# Patient Record
Sex: Female | Born: 1980 | Race: Black or African American | Hispanic: No | Marital: Single | Smoking: Former smoker
Health system: Southern US, Community
[De-identification: ages and names within clinical notes are randomized; demographics above are authoritative.]

## PROBLEM LIST (undated history)

## (undated) DIAGNOSIS — F419 Anxiety disorder, unspecified: Secondary | ICD-10-CM

## (undated) DIAGNOSIS — I10 Essential (primary) hypertension: Secondary | ICD-10-CM

## (undated) DIAGNOSIS — K219 Gastro-esophageal reflux disease without esophagitis: Secondary | ICD-10-CM

## (undated) HISTORY — PX: NO PAST SURGERIES: SHX2092

---
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
## 2009-08-08 ENCOUNTER — Emergency Department (HOSPITAL_COMMUNITY): Admission: EM | Admit: 2009-08-08 | Discharge: 2009-08-08 | Payer: Self-pay | Admitting: Emergency Medicine

## 2009-08-19 ENCOUNTER — Emergency Department (HOSPITAL_COMMUNITY): Admission: EM | Admit: 2009-08-19 | Discharge: 2009-08-19 | Payer: Self-pay | Admitting: Emergency Medicine

## 2011-06-02 ENCOUNTER — Emergency Department (INDEPENDENT_AMBULATORY_CARE_PROVIDER_SITE_OTHER)
Admission: EM | Admit: 2011-06-02 | Discharge: 2011-06-02 | Disposition: A | Discharge status: Home / Self Care | Payer: Self-pay | Source: Home / Self Care | Attending: Family Medicine | Admitting: Family Medicine

## 2011-06-02 ENCOUNTER — Encounter: Payer: Self-pay | Admitting: *Deleted

## 2011-06-02 DIAGNOSIS — IMO0002 Reserved for concepts with insufficient information to code with codable children: Secondary | ICD-10-CM

## 2011-06-02 MED ORDER — CEPHALEXIN 500 MG PO CAPS: 500.0000 mg | ORAL_CAPSULE | Freq: Four times a day (QID) | ORAL | Status: AC

## 2011-06-02 NOTE — ED Notes (Signed)
Pt c/o pain and swelling to end of right index finger onset Saturday.  No known injury.

## 2011-06-02 NOTE — ED Provider Notes (Signed)
History     CSN: [id number] Arrival date & time: 06/02/2011  8:15 AM   First MD Initiated Contact with Patient 06/02/11 [phone]      Chief Complaint  Patient presents with  . Hand Pain    (Consider location/radiation/quality/duration/timing/severity/associated sxs/prior treatment) HPI Comments: Theresa Burke presents for evaluation of pain and swelling in the distal tip of her RIGHT index finger. She denies any injury but she is unsure. There is a central puncture wound.  Patient is a 30 y.o. female presenting with hand pain. The history is provided by the patient.  Hand Pain This is a new problem. The current episode started more than 2 days ago. The problem occurs constantly. The problem has been gradually worsening. The symptoms are aggravated by nothing. The symptoms are relieved by nothing.    No past medical history on file.  No past surgical history on file.  No family history on file.  History  Substance Use Topics  . Smoking status: Not on file  . Smokeless tobacco: Not on file  . Alcohol Use: Not on file    OB History    No data available      Review of Systems  Constitutional: Negative.   HENT: Negative.   Eyes: Negative.   Respiratory: Negative.   Cardiovascular: Negative.   Gastrointestinal: Negative.   Genitourinary: Negative.   Musculoskeletal: Negative.   Skin: Positive for wound.       Pain and swelling in tip of RIGHT index finger  Neurological: Negative.     Allergies  Review of patient's allergies indicates no known allergies.  Home Medications  No current outpatient prescriptions on file.  BP 132/77  Pulse 99  Temp(Src) 98.7 F (37.1 C) (Oral)  Resp 18  SpO2 99%  Physical Exam  Constitutional: She is oriented to person, place, and time. She appears well-developed and well-nourished.  HENT:  Head: Normocephalic and atraumatic.  Eyes: EOM are normal.  Neck: Normal range of motion.  Pulmonary/Chest: Effort normal and breath sounds  normal.  Neurological: She is alert and oriented to person, place, and time.  Skin: Skin is warm and dry.       ED Course  INCISION AND DRAINAGE Date/Time: 06/02/2011 8:56 AM Performed by: Richardo Priest Authorized by: Richardo Priest Consent: Verbal consent obtained. Risks and benefits: risks, benefits and alternatives were discussed Type: abscess Body area: upper extremity Location details: right index finger Local anesthetic: topical anesthetic and lidocaine spray Scalpel size: 11 Incision type: single straight Complexity: simple Drainage: purulent Drainage amount: copious Patient tolerance: Patient tolerated the procedure well with no immediate complications.   (including critical care time)  Labs Reviewed - No data to display No results found.   No diagnosis found.    MDM  I&D of paronychia        Richardo Priest, MD 06/02/11 [phone]

## 2011-10-05 ENCOUNTER — Encounter (HOSPITAL_COMMUNITY): Payer: Self-pay

## 2011-10-05 ENCOUNTER — Emergency Department (INDEPENDENT_AMBULATORY_CARE_PROVIDER_SITE_OTHER)
Admission: EM | Admit: 2011-10-05 | Discharge: 2011-10-05 | Disposition: A | Discharge status: Home / Self Care | Payer: Self-pay | Source: Home / Self Care | Attending: Family Medicine | Admitting: Family Medicine

## 2011-10-05 DIAGNOSIS — Z202 Contact with and (suspected) exposure to infections with a predominantly sexual mode of transmission: Secondary | ICD-10-CM

## 2011-10-05 DIAGNOSIS — Z9189 Other specified personal risk factors, not elsewhere classified: Secondary | ICD-10-CM

## 2011-10-05 LAB — POCT URINALYSIS DIP (DEVICE)
Hgb urine dipstick: NEGATIVE
Ketones, ur: 40 mg/dL — AB
Leukocytes, UA: NEGATIVE
pH: 5.5 (ref 5.0–8.0)

## 2011-10-05 MED ORDER — METRONIDAZOLE 500 MG PO TABS: 2000.0000 mg | ORAL_TABLET | Freq: Once | ORAL | Status: AC

## 2011-10-05 MED ORDER — AZITHROMYCIN 250 MG PO TABS
ORAL_TABLET | ORAL | Status: AC
  Filled 2011-10-05: qty 4

## 2011-10-05 MED ORDER — CEFTRIAXONE SODIUM 250 MG IJ SOLR
INTRAMUSCULAR | Status: AC
  Filled 2011-10-05: qty 250

## 2011-10-05 MED ORDER — CEFTRIAXONE SODIUM 250 MG IJ SOLR
250.0000 mg | Freq: Once | INTRAMUSCULAR | Status: AC
  Administered 2011-10-05: 250 mg via INTRAMUSCULAR

## 2011-10-05 MED ORDER — LIDOCAINE HCL (PF) 1 % IJ SOLN
INTRAMUSCULAR | Status: AC
  Filled 2011-10-05: qty 5

## 2011-10-05 MED ORDER — AZITHROMYCIN 250 MG PO TABS
1000.0000 mg | ORAL_TABLET | Freq: Once | ORAL | Status: AC
  Administered 2011-10-05: 1000 mg via ORAL

## 2011-10-05 NOTE — Discharge Instructions (Signed)
We have treated you for gonococcal as well as non-gonococcal disease. We have also treated you for chlamydial infection as well. I have provided you with a rx for metronidazole; this will treat trichomoniasis. Take all 4 pills in one sitting. Return to care should your symptoms not improve, or worsen in any way.

## 2011-10-05 NOTE — ED Notes (Signed)
patient in same sex relationship; no female sexual partners

## 2011-10-05 NOTE — ED Notes (Signed)
There was no pelvic exam done, no samples for STD testing , patient treated empirically

## 2011-10-05 NOTE — ED Provider Notes (Addendum)
History     CSN: [id number]  Arrival date & time 10/05/11  1242   First MD Initiated Contact with Patient 10/05/11 1327      Chief Complaint  Patient presents with  . Exposure to STD    (Consider location/radiation/quality/duration/timing/severity/associated sxs/prior treatment) HPI Comments: Theresa Burke presents for evaluation of possible exposure to STD. She reports that she was informed by her, now ex, female partner that she should be checked for gonorrhea and Chlamydia. The female partner reports a current diagnosis and treatment of gonorrhea. Theresa Burke denies any sexual contact with this person in over 3 weeks. She denies any new sexual partners. She elects to be treated. She denies any symptoms, no vaginal discharge, no vaginal pain, no dysuria.  Patient is a 31 y.o. female presenting with STD exposure. The history is provided by the patient.  Exposure to STD This is a new problem. The symptoms are aggravated by nothing. The symptoms are relieved by nothing. She has tried nothing for the symptoms.    History reviewed. No pertinent past medical history.  History reviewed. No pertinent past surgical history.  Family History  Problem Relation Age of Onset  . Cancer Mother     History  Substance Use Topics  . Smoking status: Current Everyday Smoker -- 0.5 packs/day  . Smokeless tobacco: Not on file  . Alcohol Use: Yes     social    OB History    Grav Para Term Preterm Abortions TAB SAB Ect Mult Living                  Review of Systems  Constitutional: Negative.   HENT: Negative.   Eyes: Negative.   Respiratory: Negative.   Cardiovascular: Negative.   Gastrointestinal: Negative.   Genitourinary: Negative.  Negative for dysuria, urgency, hematuria, flank pain, decreased urine volume, vaginal bleeding, vaginal discharge, vaginal pain, menstrual problem and pelvic pain.  Musculoskeletal: Negative.   Skin: Negative.   Neurological: Negative.     Allergies  Review of  patient's allergies indicates no known allergies.  Home Medications   Current Outpatient Rx  Name Route Sig Dispense Refill  . METRONIDAZOLE 500 MG PO TABS Oral Take 4 tablets (2,000 mg total) by mouth once. 4 tablet 0    BP 128/90  Pulse 95  Temp(Src) 99.2 F (37.3 C) (Oral)  Resp 18  SpO2 100%  LMP 10/01/2011  Physical Exam  Nursing note and vitals reviewed. Constitutional: She is oriented to person, place, and time. She appears well-developed and well-nourished.  HENT:  Head: Normocephalic and atraumatic.  Eyes: EOM are normal.  Neck: Normal range of motion.  Pulmonary/Chest: Effort normal.  Musculoskeletal: Normal range of motion.  Neurological: She is alert and oriented to person, place, and time.  Skin: Skin is warm and dry.  Psychiatric: Her behavior is normal.    ED Course  Procedures (including critical care time)  Labs Reviewed  POCT URINALYSIS DIP (DEVICE) - Abnormal; Notable for the following:    Bilirubin Urine SMALL (*)    Ketones, ur 40 (*)    Protein, ur 30 (*)    All other components within normal limits  POCT PREGNANCY, URINE   No results found.   1. Possible exposure to STD       MDM  Labs reviewed; will treat empirically with ceftriaxone 250 mg IM x 1; azithromycin 1 gm PO x 1        Renaee Munda, MD 10/05/11 1556  Renaee Munda,  MD 10/05/11 1600

## 2011-10-05 NOTE — ED Notes (Signed)
States she was informed by her female partner she would need to be checked for [facility]; pt c/o lower abdominal pain ; no female sexual partners

## 2012-04-28 ENCOUNTER — Encounter (HOSPITAL_COMMUNITY): Payer: Self-pay | Admitting: Adult Health

## 2012-04-28 ENCOUNTER — Emergency Department (HOSPITAL_COMMUNITY)
Admission: EM | Admit: 2012-04-28 | Discharge: 2012-04-29 | Disposition: A | Discharge status: Home / Self Care | Payer: Self-pay | Attending: Emergency Medicine | Admitting: Emergency Medicine

## 2012-04-28 DIAGNOSIS — F17200 Nicotine dependence, unspecified, uncomplicated: Secondary | ICD-10-CM | POA: Insufficient documentation

## 2012-04-28 DIAGNOSIS — Y929 Unspecified place or not applicable: Secondary | ICD-10-CM | POA: Insufficient documentation

## 2012-04-28 DIAGNOSIS — IMO0002 Reserved for concepts with insufficient information to code with codable children: Secondary | ICD-10-CM | POA: Insufficient documentation

## 2012-04-28 DIAGNOSIS — L03019 Cellulitis of unspecified finger: Secondary | ICD-10-CM | POA: Insufficient documentation

## 2012-04-28 DIAGNOSIS — Y939 Activity, unspecified: Secondary | ICD-10-CM | POA: Insufficient documentation

## 2012-04-28 MED ORDER — BUPIVACAINE HCL (PF) 0.5 % IJ SOLN
10.0000 mL | Freq: Once | INTRAMUSCULAR | Status: AC
  Administered 2012-04-29: 10 mL
  Filled 2012-04-28: qty 10

## 2012-04-28 NOTE — ED Provider Notes (Addendum)
History     CSN: [id number]  Arrival date & time 04/28/12  2206   First MD Initiated Contact with Patient 04/28/12 2223      Chief Complaint  Patient presents with  . Finger Injury    (Consider location/radiation/quality/duration/timing/severity/associated sxs/prior treatment) HPI Comments: R 3rd finger tip red swollen around the cuticle area, nails, deeply, as well as the cuticles.  She hit her finger of the car door frame yesterday with increasing pain and swelling  The history is provided by the patient.    History reviewed. No pertinent past medical history.  History reviewed. No pertinent past surgical history.  Family History  Problem Relation Age of Onset  . Cancer Mother     History  Substance Use Topics  . Smoking status: Current Every Day Smoker -- 0.5 packs/day  . Smokeless tobacco: Not on file  . Alcohol Use: Yes     social    OB History    Grav Para Term Preterm Abortions TAB SAB Ect Mult Living                  Review of Systems  Constitutional: Negative for fever and chills.  Musculoskeletal: Negative for joint swelling.  Skin: Negative for wound.  Neurological: Negative for dizziness, weakness and numbness.    Allergies  Review of patient's allergies indicates no known allergies.  Home Medications  No current outpatient prescriptions on file.  BP 133/74  Pulse 88  Temp 98.6 F (37 C) (Oral)  Resp 18  SpO2 98%  Physical Exam  Constitutional: She is oriented to person, place, and time. She appears well-nourished.  HENT:  Head: Normocephalic.  Eyes: Pupils are equal, round, and reactive to light.  Cardiovascular: Normal rate.   Pulmonary/Chest: Effort normal.  Musculoskeletal: Normal range of motion. She exhibits edema and tenderness.       It appears as though she has a paronychia of the R 3rd finger tip   Neurological: She is alert and oriented to person, place, and time.  Skin: Skin is warm. She is diaphoretic.    ED Course    Drain paronychia Date/Time: 04/29/2012 12:03 AM Performed by: Arman Filter Authorized by: Arman Filter Consent: Verbal consent obtained. Risks and benefits: risks, benefits and alternatives were discussed Consent given by: patient Patient understanding: patient states understanding of the procedure being performed Patient identity confirmed: verbally with patient Time out: Immediately prior to procedure a "time out" was called to verify the correct patient, procedure, equipment, support staff and site/side marked as required. Local anesthesia used: yes Anesthesia: digital block Local anesthetic: bupivacaine 0.5% without epinephrine Anesthetic total: 2 ml Patient sedated: no Patient tolerance: Patient tolerated the procedure well with no immediate complications. Comments: 11 blade   (including critical care time)  Labs Reviewed - No data to display No results found.   1. Paronychia       MDM         paronychia  Arman Filter, NP 04/29/12 0005  Arman Filter, NP 05/22/12 2057

## 2012-04-28 NOTE — ED Notes (Signed)
Pt reports swelling and pain to (R) middle finger after hitting it Tuesday.

## 2012-04-28 NOTE — ED Notes (Signed)
Right middle tip of finger swollen and painful. Hit finger Tuesday and began swelling yesterday.  CMS intact.

## 2012-04-29 ENCOUNTER — Encounter (HOSPITAL_COMMUNITY): Payer: Self-pay | Admitting: *Deleted

## 2012-04-29 ENCOUNTER — Emergency Department (HOSPITAL_COMMUNITY)
Admission: EM | Admit: 2012-04-29 | Discharge: 2012-04-29 | Disposition: A | Discharge status: Home / Self Care | Payer: Self-pay | Attending: Emergency Medicine | Admitting: Emergency Medicine

## 2012-04-29 DIAGNOSIS — M79646 Pain in unspecified finger(s): Secondary | ICD-10-CM

## 2012-04-29 DIAGNOSIS — F17200 Nicotine dependence, unspecified, uncomplicated: Secondary | ICD-10-CM | POA: Insufficient documentation

## 2012-04-29 DIAGNOSIS — M79609 Pain in unspecified limb: Secondary | ICD-10-CM | POA: Insufficient documentation

## 2012-04-29 MED ORDER — CEPHALEXIN 500 MG PO CAPS: 500.0000 mg | ORAL_CAPSULE | Freq: Four times a day (QID) | ORAL | Status: DC

## 2012-04-29 MED ORDER — IBUPROFEN 600 MG PO TABS: 600.0000 mg | ORAL_TABLET | Freq: Four times a day (QID) | ORAL | Status: DC | PRN

## 2012-04-29 NOTE — ED Notes (Signed)
Pt was just discharged from ED for tx of paronychia.  She states the pain is increasing and she was not given any pain medications.

## 2012-04-29 NOTE — ED Provider Notes (Signed)
History     CSN: [id number]  Arrival date & time 04/29/12  0255   First MD Initiated Contact with Patient 04/29/12 0531      Chief Complaint  Patient presents with  . Hand Pain    (Consider location/radiation/quality/duration/timing/severity/associated sxs/prior treatment) Patient is a 31 y.o. female presenting with hand pain. The history is provided by the patient.  Hand Pain This is a recurrent problem. The problem occurs constantly. The problem has not changed since onset.Pertinent negatives include no chest pain, no abdominal pain, no headaches and no shortness of breath. Nothing aggravates the symptoms. Nothing relieves the symptoms. The treatment provided no relief.    History reviewed. No pertinent past medical history.  History reviewed. No pertinent past surgical history.  Family History  Problem Relation Age of Onset  . Cancer Mother     History  Substance Use Topics  . Smoking status: Current Every Day Smoker -- 0.5 packs/day  . Smokeless tobacco: Not on file  . Alcohol Use: Yes     social    OB History    Grav Para Term Preterm Abortions TAB SAB Ect Mult Living                  Review of Systems  Respiratory: Negative for shortness of breath.   Cardiovascular: Negative for chest pain.  Gastrointestinal: Negative for abdominal pain.  Neurological: Negative for headaches.  All other systems reviewed and are negative.    Allergies  Review of patient's allergies indicates no known allergies.  Home Medications   Current Outpatient Rx  Name Route Sig Dispense Refill  . IBUPROFEN 200 MG PO TABS Oral Take 400 mg by mouth every 6 (six) hours as needed. For pain or fever      BP 100/87  Pulse 90  Temp 98 F (36.7 C) (Oral)  Resp 16  SpO2 98%  Physical Exam  Constitutional: She is oriented to person, place, and time. She appears well-developed and well-nourished. No distress.  HENT:  Head: Normocephalic and atraumatic.  Mouth/Throat:  Oropharynx is clear and moist.  Eyes: Conjunctivae normal are normal. Pupils are equal, round, and reactive to light.  Neck: Normal range of motion. Neck supple.  Cardiovascular: Normal rate and regular rhythm.   Pulmonary/Chest: Effort normal and breath sounds normal. She has no wheezes.  Abdominal: Soft. Bowel sounds are normal.  Musculoskeletal: Normal range of motion.       No further paronychia at this time no felon  Neurological: She is alert and oriented to person, place, and time.  Skin: Skin is warm and dry.  Psychiatric: She has a normal mood and affect.    ED Course  Procedures (including critical care time)  Labs Reviewed - No data to display No results found.   No diagnosis found.    MDM  Will start keflex        Carmel Garfield Smitty Cords, MD 04/29/12 5621

## 2012-04-29 NOTE — ED Provider Notes (Signed)
Medical screening examination/treatment/procedure(s) were performed by non-physician practitioner and as supervising physician I was immediately available for consultation/collaboration.  Jasmine Awe, MD 04/29/12 [phone]

## 2012-05-22 NOTE — ED Provider Notes (Signed)
History     CSN: [id number]  Arrival date & time 04/29/12  0255   First MD Initiated Contact with Patient 04/29/12 0531      Chief Complaint  Patient presents with  . Hand Pain    (Consider location/radiation/quality/duration/timing/severity/associated sxs/prior treatment) Patient is a 31 y.o. female presenting with hand pain.  Hand Pain    History reviewed. No pertinent past medical history.  History reviewed. No pertinent past surgical history.  Family History  Problem Relation Age of Onset  . Cancer Mother     History  Substance Use Topics  . Smoking status: Current Every Day Smoker -- 0.5 packs/day  . Smokeless tobacco: Not on file  . Alcohol Use: Yes     Comment: social    OB History    Grav Para Term Preterm Abortions TAB SAB Ect Mult Living                  Review of Systems  Allergies  Review of patient's allergies indicates no known allergies.  Home Medications   Current Outpatient Rx  Name  Route  Sig  Dispense  Refill  . IBUPROFEN 200 MG PO TABS   Oral   Take 400 mg by mouth every 6 (six) hours as needed. For pain or fever         . CEPHALEXIN 500 MG PO CAPS   Oral   Take 1 capsule (500 mg total) by mouth 4 (four) times daily.   28 capsule   0   . IBUPROFEN 600 MG PO TABS   Oral   Take 1 tablet (600 mg total) by mouth every 6 (six) hours as needed for pain.   30 tablet   0     BP 100/87  Pulse 90  Temp 98 F (36.7 C) (Oral)  Resp 16  SpO2 98%  Physical Exam  ED Course  Procedures (including critical care time)  Labs Reviewed - No data to display No results found.   1. Finger pain       MDM          Arman Filter, NP 05/25/12 [phone]

## 2012-05-22 NOTE — ED Provider Notes (Signed)
Medical screening examination/treatment/procedure(s) were performed by non-physician practitioner and as supervising physician I was immediately available for consultation/collaboration.  Jasmine Awe, MD 05/22/12 2300

## 2012-06-06 NOTE — ED Provider Notes (Signed)
Medical screening examination/treatment/procedure(s) were performed by non-physician practitioner and as supervising physician I was immediately available for consultation/collaboration.  Jasmine Awe, MD 06/06/12 2303

## 2015-03-15 ENCOUNTER — Emergency Department (HOSPITAL_COMMUNITY)
Admission: EM | Admit: 2015-03-15 | Discharge: 2015-03-15 | Disposition: A | Discharge status: Home / Self Care | Payer: Self-pay | Attending: Emergency Medicine | Admitting: Emergency Medicine

## 2015-03-15 ENCOUNTER — Encounter (HOSPITAL_COMMUNITY): Payer: Self-pay | Admitting: *Deleted

## 2015-03-15 DIAGNOSIS — Z720 Tobacco use: Secondary | ICD-10-CM | POA: Insufficient documentation

## 2015-03-15 DIAGNOSIS — M79641 Pain in right hand: Secondary | ICD-10-CM | POA: Insufficient documentation

## 2015-03-15 DIAGNOSIS — Z792 Long term (current) use of antibiotics: Secondary | ICD-10-CM | POA: Insufficient documentation

## 2015-03-15 MED ORDER — CEPHALEXIN 500 MG PO CAPS
1000.0000 mg | ORAL_CAPSULE | Freq: Two times a day (BID) | ORAL | Status: DC
Start: 2015-03-15 — End: 2018-11-25

## 2015-03-15 MED ORDER — IBUPROFEN 600 MG PO TABS: 600.0000 mg | ORAL_TABLET | Freq: Four times a day (QID) | ORAL | Status: DC | PRN

## 2015-03-15 NOTE — ED Notes (Signed)
Patient presents with right index finger swollen.  Stated she woke up with it being swollen  No known injury

## 2015-03-15 NOTE — Discharge Instructions (Signed)
Ice your hand several times a day.  Take ibuprofen for pain.  If redness increases then take antibiotic for the full duration.  Return to ER if your condition worsen.

## 2015-03-15 NOTE — ED Provider Notes (Signed)
CSN: [id number]     Arrival date & time 03/15/15  1906 History  This chart was scribed for non-physician practitioner Fayrene Helper, PA-C working with Zadie Rhine, MD by Lyndel Safe, ED Scribe. This patient was seen in room TR06C/TR06C and the patient's care was started at 7:44 PM.   Chief Complaint  Patient presents with  . Finger Injury    The history is provided by the patient. No language interpreter was used.   HPI Comments: Theresa Burke is a 34 y.o. female, with no pertinent PMhx, who presents to the Emergency Department complaining of sudden onset, constant, 8/10 proximal right index finger pain and swelling that began this morning. Pt is right-handed. 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 Denies numbness or tingling, any injury or trauma to the area, any pertinent PMhx, or a history of similar pain to right hand.   History reviewed. No pertinent past medical history. History reviewed. No pertinent past surgical history. Family History  Problem Relation Age of Onset  . Cancer Mother    Social History  Substance Use Topics  . Smoking status: Current Every Day Smoker -- 0.50 packs/day  . Smokeless tobacco: Never Used  . Alcohol Use: Yes     Comment: social   OB History    No data available     Review of Systems  Musculoskeletal: Positive for joint swelling ( right index finger) and arthralgias ( right index finger).   Allergies  Review of patient's allergies indicates no known allergies.  Home Medications   Prior to Admission medications   Medication Sig Start Date End Date Taking? Authorizing Provider  cephALEXin (KEFLEX) 500 MG capsule Take 1 capsule (500 mg total) by mouth 4 (four) times daily. 04/29/12   April Palumbo, MD  ibuprofen (ADVIL,MOTRIN) 200 MG tablet Take 400 mg by mouth every 6 (six) hours as needed. For pain or fever    Historical Provider, MD  ibuprofen (ADVIL,MOTRIN) 600 MG tablet Take 1 tablet (600 mg total)  by mouth every 6 (six) hours as needed for pain. 04/29/12   April Palumbo, MD   BP 112/78 mmHg  Pulse 77  Temp(Src) 98.2 F (36.8 C) (Oral)  Resp 18  SpO2 98%  LMP 03/01/2015 Physical Exam  Constitutional: She appears well-developed and well-nourished. No distress.  HENT:  Head: Normocephalic.  Eyes: Conjunctivae are normal.  Neck: No JVD present.  Cardiovascular: Intact distal pulses.   Pulmonary/Chest: Effort normal. No respiratory distress.  Musculoskeletal: She exhibits tenderness.  Right hand; point tenderness to the 2nd MCP on the volar aspcet with mild surrounding erythema, no crepitus, normal grip strength, brisk cap refil to all fingers, radial pulse 2+, no pain at anatomical snuff box.  Neurological: She is alert. Coordination normal.  Skin: Skin is warm. No rash noted. No erythema. No pallor.  Psychiatric: She has a normal mood and affect. Her behavior is normal.  Nursing note and vitals reviewed.   ED Course  Procedures  DIAGNOSTIC STUDIES: Oxygen Saturation is 98% on RA, normal by my interpretation.    COORDINATION OF CARE: 7:49 PM Discussed treatment plan which includes to prescribe antibiotic course with pt. Advised pt to ice affected area and take ibuprofen but to start antibiotic course if symptoms persist. Pt acknowledges and agrees to plan.    MDM   Final diagnoses:  Right hand pain    BP 112/78 mmHg  Pulse 77  Temp(Src) 98.2 F (36.8 C) (Oral)  Resp  18  SpO2 98%  LMP 03/01/2015   I personally performed the services described in this documentation, which was scribed in my presence. The recorded information has been reviewed and is accurate.     Fayrene Helper, PA-C 03/15/15 1952  Zadie Rhine, MD 03/15/15 [phone]

## 2018-02-13 ENCOUNTER — Encounter (HOSPITAL_COMMUNITY): Payer: Self-pay | Admitting: Emergency Medicine

## 2018-02-13 ENCOUNTER — Emergency Department (HOSPITAL_COMMUNITY)
Admission: EM | Admit: 2018-02-13 | Discharge: 2018-02-13 | Disposition: A | Discharge status: Home / Self Care | Payer: Self-pay | Attending: Emergency Medicine | Admitting: Emergency Medicine

## 2018-02-13 DIAGNOSIS — F17200 Nicotine dependence, unspecified, uncomplicated: Secondary | ICD-10-CM | POA: Insufficient documentation

## 2018-02-13 DIAGNOSIS — R4182 Altered mental status, unspecified: Secondary | ICD-10-CM | POA: Insufficient documentation

## 2018-02-13 LAB — CK: CK TOTAL: 305 U/L — AB (ref 38–234)

## 2018-02-13 LAB — CBC WITH DIFFERENTIAL/PLATELET
BASOS PCT: 0 %
Basophils Absolute: 0 10*3/uL (ref 0.0–0.1)
EOS ABS: 0 10*3/uL (ref 0.0–0.7)
EOS PCT: 0 %
HCT: 33.9 % — ABNORMAL LOW (ref 36.0–46.0)
HEMOGLOBIN: 10.8 g/dL — AB (ref 12.0–15.0)
Lymphocytes Relative: 13 %
Lymphs Abs: 1 10*3/uL (ref 0.7–4.0)
MCH: 29.9 pg (ref 26.0–34.0)
MCHC: 31.9 g/dL (ref 30.0–36.0)
MCV: 93.9 fL (ref 78.0–100.0)
MONO ABS: 0.4 10*3/uL (ref 0.1–1.0)
MONOS PCT: 6 %
NEUTROS PCT: 81 %
Neutro Abs: 6 10*3/uL (ref 1.7–7.7)
PLATELETS: 274 10*3/uL (ref 150–400)
RBC: 3.61 MIL/uL — ABNORMAL LOW (ref 3.87–5.11)
RDW: 14.2 % (ref 11.5–15.5)
WBC: 7.5 10*3/uL (ref 4.0–10.5)

## 2018-02-13 LAB — RAPID URINE DRUG SCREEN, HOSP PERFORMED
AMPHETAMINES: NOT DETECTED
BARBITURATES: NOT DETECTED
BENZODIAZEPINES: POSITIVE — AB
Cocaine: NOT DETECTED
Tetrahydrocannabinol: POSITIVE — AB

## 2018-02-13 LAB — COMPREHENSIVE METABOLIC PANEL
ALBUMIN: 3.4 g/dL — AB (ref 3.5–5.0)
ALT: 17 U/L (ref 0–44)
ANION GAP: 8 (ref 5–15)
AST: 19 U/L (ref 15–41)
Alkaline Phosphatase: 106 U/L (ref 38–126)
BUN: 22 mg/dL — ABNORMAL HIGH (ref 6–20)
CO2: 23 mmol/L (ref 22–32)
Calcium: 9.1 mg/dL (ref 8.9–10.3)
Chloride: 108 mmol/L (ref 98–111)
Creatinine, Ser: 0.9 mg/dL (ref 0.44–1.00)
GFR calc Af Amer: >60 mL/min (ref >60)
GFR calc non Af Amer: >60 mL/min (ref >60)
GLUCOSE: 99 mg/dL (ref 70–99)
POTASSIUM: 3.3 mmol/L — AB (ref 3.5–5.1)
SODIUM: 139 mmol/L (ref 135–145)
Total Bilirubin: 0.4 mg/dL (ref 0.3–1.2)
Total Protein: 7 g/dL (ref 6.5–8.1)

## 2018-02-13 LAB — I-STAT TROPONIN, ED: Troponin i, poc: 0.04 ng/mL (ref 0.00–0.08)

## 2018-02-13 LAB — I-STAT BETA HCG BLOOD, ED (MC, WL, AP ONLY)

## 2018-02-13 LAB — ETHANOL: Alcohol, Ethyl (B): <10 mg/dL (ref <10)

## 2018-02-13 MED ORDER — LORAZEPAM 2 MG/ML IJ SOLN
1.0000 mg | Freq: Once | INTRAMUSCULAR | Status: AC
  Administered 2018-02-13: 1 mg via INTRAVENOUS
  Filled 2018-02-13: qty 1

## 2018-02-13 MED ORDER — SODIUM CHLORIDE 0.9 % IV BOLUS
1000.0000 mL | Freq: Once | INTRAVENOUS | Status: AC
  Administered 2018-02-13: 1000 mL via INTRAVENOUS

## 2018-02-13 NOTE — ED Triage Notes (Signed)
Patient was on a first date and ingested 4 gummi edibles. She then became paranoid and physically aggressive. EMS picked her up at her apartment. 5 mg of haldol and 5 mg of versed were administered. They report she was tachypneic and diaphoretic upon their arrival.

## 2018-02-13 NOTE — ED Notes (Signed)
Bed: [id number] Expected date:  Expected time:  Means of arrival:  Comments: EMS 37 yo female /doing eatables-aggressive behavior/midazolam and haldol

## 2018-02-13 NOTE — ED Provider Notes (Signed)
[facility]-EMERGENCY DEPT Provider Note   CSN: [id number] Arrival date & time: 02/13/18  0227     History   Chief Complaint Chief Complaint  Patient presents with  . Aggressive Behavior  . Altered Mental Status    HPI Theresa Burke is a 37 y.o. female.  Patient brought to the emergency department by EMS from home.  She reports taking several edibles at home and then started feeling "not right".  Patient cannot further describe what she is feeling currently.  She apparently became extremely aggressive when police and EMS responded to her home, was administered Versed and Haldol.  On arrival to the ER patient continually says "I am sorry" and "I am scared". Level V Caveat due to psychiatric condition.     History reviewed. No pertinent past medical history.  There are no active problems to display for this patient.   History reviewed. No pertinent surgical history.   OB History   None      Home Medications    Prior to Admission medications   Medication Sig Start Date End Date Taking? Authorizing Provider  cephALEXin (KEFLEX) 500 MG capsule Take 2 capsules (1,000 mg total) by mouth 2 (two) times daily. 03/15/15   Fayrene Helperran, Bowie, PA-C  ibuprofen (ADVIL,MOTRIN) 600 MG tablet Take 1 tablet (600 mg total) by mouth every 6 (six) hours as needed for moderate pain. 03/15/15   Fayrene Helperran, [place], PA-C    Family History Family History  Problem Relation Age of Onset  . Cancer Mother     Social History Social History   Tobacco Use  . Smoking status: Current Every Day Smoker    Packs/day: 0.50  . Smokeless tobacco: Never Used  Substance Use Topics  . Alcohol use: Yes    Comment: social  . Drug use: No     Allergies   Patient has no known allergies.   Review of Systems Review of Systems  Unable to perform ROS: Psychiatric disorder     Physical Exam Updated Vital Signs BP 138/66 (BP Location: Left Arm)   Pulse (!) 137   Temp 99.2 F (37.3 C)  (Oral)   Resp (!) 22   Ht 5\' 4"  (1.626 m)   Wt 97.5 kg   SpO2 100%   BMI 36.90 kg/m   Physical Exam  Constitutional: She is oriented to person, place, and time. She appears well-developed and well-nourished. No distress.  HENT:  Head: Normocephalic and atraumatic.  Right Ear: Hearing normal.  Left Ear: Hearing normal.  Nose: Nose normal.  Mouth/Throat: Oropharynx is clear and moist and mucous membranes are normal.  Eyes: Pupils are equal, round, and reactive to light. Conjunctivae and EOM are normal.  Neck: Normal range of motion. Neck supple.  Cardiovascular: Regular rhythm, S1 normal and S2 normal. Exam reveals no gallop and no friction rub.  No murmur heard. Pulmonary/Chest: Effort normal and breath sounds normal. No respiratory distress. She exhibits no tenderness.  Abdominal: Soft. Normal appearance and bowel sounds are normal. There is no hepatosplenomegaly. There is no tenderness. There is no rebound, no guarding, no tenderness at McBurney's point and negative Murphy's sign. No hernia.  Musculoskeletal: Normal range of motion.  Neurological: She is alert and oriented to person, place, and time. She has normal strength. No cranial nerve deficit or sensory deficit. Coordination normal. GCS eye subscore is 4. GCS verbal subscore is 5. GCS motor subscore is 6.  Skin: Skin is warm, dry and intact. No rash noted. No cyanosis.  Psychiatric: Her speech is normal. Her mood appears anxious. She is agitated.  Nursing note and vitals reviewed.    ED Treatments / Results  Labs (all labs ordered are listed, but only abnormal results are displayed) Labs Reviewed  CBC WITH DIFFERENTIAL/PLATELET - Abnormal; Notable for the following components:      Result Value   RBC 3.61 (*)    Hemoglobin 10.8 (*)    HCT 33.9 (*)    All other components within normal limits  COMPREHENSIVE METABOLIC PANEL - Abnormal; Notable for the following components:   Potassium 3.3 (*)    BUN 22 (*)    Albumin  3.4 (*)    All other components within normal limits  RAPID URINE DRUG SCREEN, HOSP PERFORMED - Abnormal; Notable for the following components:   Opiates   (*)    Value: Result not available. Reagent lot number recalled by manufacturer.   Benzodiazepines POSITIVE (*)    Tetrahydrocannabinol POSITIVE (*)    All other components within normal limits  CK - Abnormal; Notable for the following components:   Total CK 305 (*)    All other components within normal limits  ETHANOL  I-STAT BETA HCG BLOOD, ED ([facility], [facility], [facility] ONLY)  I-STAT TROPONIN, ED    EKG EKG Interpretation  Date/Time:  Sunday February 13 2018 02:35:49 EDT Ventricular Rate:  129 PR Interval:    QRS Duration: 77 QT Interval:  271 QTC Calculation: 397 R Axis:   44 Text Interpretation:  Sinus tachycardia Abnormal R-wave progression, early transition Borderline repolarization abnormality No previous tracing Confirmed by Gilda CreasePollina, Yailyn Strack J [phone]([id number]) on 02/13/2018 5:06:09 AM   Radiology No results found.  Procedures Procedures (including critical care time)  Medications Ordered in ED Medications  sodium chloride 0.9 % bolus 1,000 mL (1,000 mLs Intravenous New Bag/Given 02/13/18 0320)  LORazepam (ATIVAN) injection 1 mg (1 mg Intravenous Given 02/13/18 0320)     Initial Impression / Assessment and Plan / ED Course  I have reviewed the triage vital signs and the nursing notes.  Pertinent labs & imaging results that were available during my care of the patient were reviewed by me and considered in my medical decision making (see chart for details).     Patient brought to the emergency department for evaluation secondary to agitation, aggressive behavior and paranoia that began after eating for gummy edibles with unknown contents.  Drug screen positive for THC (benzodiazepines administered by EMS and myself).   Patient administered Versed and Haldol by EMS was still very anxious and agitated at arrival.  She was given  additional IV Ativan and has been monitored.  Patient has had significant improvement.  Blood work is unremarkable.  Patient now calm, will be appropriate for discharge.  Final Clinical Impressions(s) / ED Diagnoses   Final diagnoses:  Altered mental status, unspecified altered mental status type    ED Discharge Orders    None       Doni Widmer, Canary Brimhristopher J, MD 02/13/18 0530

## 2018-10-13 ENCOUNTER — Emergency Department (HOSPITAL_COMMUNITY)
Admission: EM | Admit: 2018-10-13 | Discharge: 2018-10-13 | Disposition: A | Discharge status: Home / Self Care | Payer: Self-pay | Attending: Emergency Medicine | Admitting: Emergency Medicine

## 2018-10-13 ENCOUNTER — Emergency Department (HOSPITAL_COMMUNITY): Payer: Self-pay

## 2018-10-13 ENCOUNTER — Encounter (HOSPITAL_COMMUNITY): Payer: Self-pay

## 2018-10-13 DIAGNOSIS — Z79899 Other long term (current) drug therapy: Secondary | ICD-10-CM | POA: Insufficient documentation

## 2018-10-13 DIAGNOSIS — F17200 Nicotine dependence, unspecified, uncomplicated: Secondary | ICD-10-CM | POA: Insufficient documentation

## 2018-10-13 DIAGNOSIS — F419 Anxiety disorder, unspecified: Secondary | ICD-10-CM | POA: Insufficient documentation

## 2018-10-13 LAB — COMPREHENSIVE METABOLIC PANEL
ALT: 15 U/L (ref 0–44)
AST: 18 U/L (ref 15–41)
Albumin: 4 g/dL (ref 3.5–5.0)
Alkaline Phosphatase: 109 U/L (ref 38–126)
Anion gap: 11 (ref 5–15)
BUN: 15 mg/dL (ref 6–20)
CO2: 20 mmol/L — ABNORMAL LOW (ref 22–32)
Calcium: 9.1 mg/dL (ref 8.9–10.3)
Chloride: 108 mmol/L (ref 98–111)
Creatinine, Ser: 0.74 mg/dL (ref 0.44–1.00)
GFR calc Af Amer: >60 mL/min (ref >60)
GFR calc non Af Amer: >60 mL/min (ref >60)
Glucose, Bld: 102 mg/dL — ABNORMAL HIGH (ref 70–99)
Potassium: 4 mmol/L (ref 3.5–5.1)
Sodium: 139 mmol/L (ref 135–145)
Total Bilirubin: 0.3 mg/dL (ref 0.3–1.2)
Total Protein: 8.5 g/dL — ABNORMAL HIGH (ref 6.5–8.1)

## 2018-10-13 LAB — CBC WITH DIFFERENTIAL/PLATELET
Abs Immature Granulocytes: 0.01 10*3/uL (ref 0.00–0.07)
Basophils Absolute: 0.1 10*3/uL (ref 0.0–0.1)
Basophils Relative: 1 %
Eosinophils Absolute: 0.1 10*3/uL (ref 0.0–0.5)
Eosinophils Relative: 2 %
HCT: 44.3 % (ref 36.0–46.0)
Hemoglobin: 13.6 g/dL (ref 12.0–15.0)
Immature Granulocytes: 0 %
Lymphocytes Relative: 21 %
Lymphs Abs: 1.3 10*3/uL (ref 0.7–4.0)
MCH: 29.9 pg (ref 26.0–34.0)
MCHC: 30.7 g/dL (ref 30.0–36.0)
MCV: 97.4 fL (ref 80.0–100.0)
Monocytes Absolute: 0.3 10*3/uL (ref 0.1–1.0)
Monocytes Relative: 5 %
Neutro Abs: 4.4 10*3/uL (ref 1.7–7.7)
Neutrophils Relative %: 71 %
Platelets: 266 10*3/uL (ref 150–400)
RBC: 4.55 MIL/uL (ref 3.87–5.11)
RDW: 14 % (ref 11.5–15.5)
WBC: 6.2 10*3/uL (ref 4.0–10.5)
nRBC: 0 % (ref 0.0–0.2)

## 2018-10-13 LAB — D-DIMER, QUANTITATIVE (NOT AT ARMC): D-Dimer, Quant: 0.36 ug/mL-FEU (ref 0.00–0.50)

## 2018-10-13 LAB — I-STAT BETA HCG BLOOD, ED (MC, WL, AP ONLY): I-stat hCG, quantitative: <5 m[IU]/mL (ref <5)

## 2018-10-13 MED ORDER — HYDROXYZINE HCL 25 MG PO TABS
25.0000 mg | ORAL_TABLET | Freq: Once | ORAL | Status: AC
  Administered 2018-10-13: 22:00:00 25 mg via ORAL
  Filled 2018-10-13: qty 1

## 2018-10-13 MED ORDER — HYDROXYZINE HCL 25 MG PO TABS: 25.0000 mg | ORAL_TABLET | Freq: Three times a day (TID) | ORAL | 0 refills | Status: DC | PRN

## 2018-10-13 NOTE — Discharge Instructions (Addendum)
Use Vistaril as needed for anxiety. Make sure you are staying well-hydrated while taking this medication. There is information about outpatient counseling, I recommend you follow-up for further evaluation. Return to the emergency room with any new, worsening, concerning symptoms.

## 2018-10-13 NOTE — ED Triage Notes (Signed)
Pt walked to the gas station and started having an anxiety attack, she says that she's been having anxiety attacks and short of breath after she walks

## 2018-10-13 NOTE — ED Notes (Signed)
Pt refuses to have blood draw in fear of having another anxiety attack RN made aware.

## 2018-10-13 NOTE — ED Notes (Signed)
Pt will let us get blood work after her medication starts to work, she's afraid of an anxiety attack

## 2018-10-13 NOTE — ED Notes (Addendum)
Pt refused 2nd attempt for lab work after requesting her labs to be drawn

## 2018-10-13 NOTE — ED Provider Notes (Signed)
[facility]-EMERGENCY DEPT Provider Note   CSN: [id number] Arrival date & time: 10/13/18  2016    History   Chief Complaint Chief Complaint  Patient presents with  . Anxiety    HPI Theresa Burke is a 39 y.o. female presenting for evaluation of pain, shortness of breath, and anxiety.  Patient states earlier today she had an episode that she felt was due to a panic attack.  She states she was walking, when she had acute onset shortness of breath and chest pain.  Since then, she has had continued chest pain and shortness of breath, although much more mild.  It is worse with exertion.  Patient states she has been feeling very stressed recently, as she does not have a job due to coronavirus and was anxious about what has been going on.  She denies a history of anxiety, she has never been on medication for this.  She denies a history of panic attacks.  She denies recent fevers, chills, sore throat, cough, nausea, vomiting, abdominal pain, urinary symptoms, normal bowel movements.  She takes no medications daily.  She denies recent travel, surgeries, mobilization, trauma, history of cancer, history of previous DVT/PE, or hormone use.     HPI  History reviewed. No pertinent past medical history.  There are no active problems to display for this patient.   History reviewed. No pertinent surgical history.   OB History   No obstetric history on file.      Home Medications    Prior to Admission medications   Medication Sig Start Date End Date Taking? Authorizing Provider  cephALEXin (KEFLEX) 500 MG capsule Take 2 capsules (1,000 mg total) by mouth 2 (two) times daily. 03/15/15   Fayrene Helper, PA-C  hydrOXYzine (ATARAX/VISTARIL) 25 MG tablet Take 1 tablet (25 mg total) by mouth every 8 (eight) hours as needed. 10/13/18   Rossie Bretado, PA-C  ibuprofen (ADVIL,MOTRIN) 600 MG tablet Take 1 tablet (600 mg total) by mouth every 6 (six) hours as needed for moderate pain.  03/15/15   Fayrene Helper, PA-C    Family History Family History  Problem Relation Age of Onset  . Cancer Mother     Social History Social History   Tobacco Use  . Smoking status: Current Every Day Smoker    Packs/day: 0.50  . Smokeless tobacco: Never Used  Substance Use Topics  . Alcohol use: Yes    Comment: social  . Drug use: No     Allergies   Patient has no known allergies.   Review of Systems Review of Systems  Respiratory: Positive for shortness of breath.   Cardiovascular: Positive for chest pain.  Psychiatric/Behavioral: The patient is nervous/anxious.   All other systems reviewed and are negative.    Physical Exam Updated Vital Signs BP (!) 139/96 (BP Location: Left Arm)   Pulse (!) 102   Temp 98.9 F (37.2 C) (Oral)   Resp 16   LMP 10/02/2018   SpO2 99%   Physical Exam Vitals signs and nursing note reviewed.  Constitutional:      General: She is not in acute distress.    Appearance: She is well-developed.     Comments: Obese female appearing anxious, but sitting in the chair no acute distress  HENT:     Head: Normocephalic and atraumatic.  Eyes:     Extraocular Movements: Extraocular movements intact.     Conjunctiva/sclera: Conjunctivae normal.     Pupils: Pupils are equal, round, and reactive to  light.  Neck:     Musculoskeletal: Normal range of motion and neck supple.  Cardiovascular:     Rate and Rhythm: Normal rate and regular rhythm.     Pulses: Normal pulses.  Pulmonary:     Effort: Pulmonary effort is normal. No respiratory distress.     Breath sounds: Normal breath sounds. No wheezing.     Comments: Speaking in full sentences.  Clear lung sounds in all fields.  Tenderness palpation the anterior chest wall Chest:     Chest wall: Tenderness present.  Abdominal:     General: There is no distension.     Palpations: Abdomen is soft. There is no mass.     Tenderness: There is no abdominal tenderness. There is no guarding or rebound.   Musculoskeletal: Normal range of motion.  Skin:    General: Skin is warm and dry.  Neurological:     Mental Status: She is alert and oriented to person, place, and time.      ED Treatments / Results  Labs (all labs ordered are listed, but only abnormal results are displayed) Labs Reviewed  COMPREHENSIVE METABOLIC PANEL - Abnormal; Notable for the following components:      Result Value   CO2 20 (*)    Glucose, Bld 102 (*)    Total Protein 8.5 (*)    All other components within normal limits  CBC WITH DIFFERENTIAL/PLATELET  D-DIMER, QUANTITATIVE (NOT AT [facility])  I-STAT BETA HCG BLOOD, ED ([facility], [facility], [facility] ONLY)    EKG EKG Interpretation  Date/Time:  Thursday October 13 2018 21:54:00 EDT Ventricular Rate:  94 PR Interval:    QRS Duration: 80 QT Interval:  324 QTC Calculation: 406 R Axis:   44 Text Interpretation:  Sinus rhythm Borderline repolarization abnormality Confirmed by Lorre Nick ([id number]) on 10/13/2018 9:58:15 PM   Radiology Dg Chest 2 View  Result Date: 10/13/2018 CLINICAL DATA:  Anxiety attack and shortness of breath EXAM: CHEST - 2 VIEW COMPARISON:  None. FINDINGS: The heart size and mediastinal contours are within normal limits. Both lungs are clear. The visualized skeletal structures are unremarkable. IMPRESSION: No active cardiopulmonary disease. Electronically Signed   By: Alcide Clever M.D.   On: 10/13/2018 21:46    Procedures Procedures (including critical care time)  Medications Ordered in ED Medications  hydrOXYzine (ATARAX/VISTARIL) tablet 25 mg (25 mg Oral Given 10/13/18 2150)     Initial Impression / Assessment and Plan / ED Course  I have reviewed the triage vital signs and the nursing notes.  Pertinent labs & imaging results that were available during my care of the patient were reviewed by me and considered in my medical decision making (see chart for details).        Patient presenting for evaluation of chest pain or shortness of breath.   Patient is convinced this is due to anxiety, however no history of previous.  As patient is tachycardic, and symptoms are worse with ambulation, concern for possible PE.  Lower suspicion for ACS at this time, however will obtain EKG for further evaluation.  Will order labs and cxr.  Droxia seen for anxiety.  Chest x-ray viewed interpreted by me, no pneumonia, pneumothorax, effusion, cardiomegaly.  EKG injury per previous, no signs of STEMI.  Patient initially refused labs, however eventually was agreeable to this.  Labs pending. On reassessment, patient reports she is feeling less anxious/sob with the hydroxyzine.  Labs reassuring.  ddimer negative. At this time, low suspicion for PE or acs  at this time. Likely anxiety. Discussed finding with pt. She was given [facility]. At this time, pt appears safe for d/c. Return precautions given. Pt states she understands and agrees to plan.   Final Clinical Impressions(s) / ED Diagnoses   Final diagnoses:  Anxiety    ED Discharge Orders         Ordered    hydrOXYzine (ATARAX/VISTARIL) 25 MG tablet  Every 8 hours PRN     10/13/18 2250           Alveria ApleyCaccavale, Averly Ericson, PA-C 10/13/18 2256    Lorre NickAllen, Anthony, MD 10/19/18 [phone]

## 2018-10-13 NOTE — ED Notes (Signed)
Bed: WLPT4 Expected date:  Expected time:  Means of arrival:  Comments: 

## 2018-11-24 ENCOUNTER — Emergency Department (HOSPITAL_COMMUNITY): Payer: Self-pay

## 2018-11-24 ENCOUNTER — Emergency Department (HOSPITAL_COMMUNITY)
Admission: EM | Admit: 2018-11-24 | Discharge: 2018-11-24 | Disposition: A | Discharge status: Home / Self Care | Payer: Self-pay | Attending: Emergency Medicine | Admitting: Emergency Medicine

## 2018-11-24 ENCOUNTER — Other Ambulatory Visit: Payer: Self-pay

## 2018-11-24 ENCOUNTER — Encounter (HOSPITAL_COMMUNITY): Payer: Self-pay

## 2018-11-24 DIAGNOSIS — R0602 Shortness of breath: Secondary | ICD-10-CM | POA: Insufficient documentation

## 2018-11-24 DIAGNOSIS — R0789 Other chest pain: Secondary | ICD-10-CM | POA: Insufficient documentation

## 2018-11-24 DIAGNOSIS — R079 Chest pain, unspecified: Secondary | ICD-10-CM

## 2018-11-24 DIAGNOSIS — R61 Generalized hyperhidrosis: Secondary | ICD-10-CM | POA: Insufficient documentation

## 2018-11-24 HISTORY — DX: Anxiety disorder, unspecified: F41.9

## 2018-11-24 LAB — CBC
HCT: 38.4 % (ref 36.0–46.0)
Hemoglobin: 12.2 g/dL (ref 12.0–15.0)
MCH: 30.2 pg (ref 26.0–34.0)
MCHC: 31.8 g/dL (ref 30.0–36.0)
MCV: 95 fL (ref 80.0–100.0)
Platelets: 289 10*3/uL (ref 150–400)
RBC: 4.04 MIL/uL (ref 3.87–5.11)
RDW: 13.2 % (ref 11.5–15.5)
WBC: 5.3 10*3/uL (ref 4.0–10.5)
nRBC: 0 % (ref 0.0–0.2)

## 2018-11-24 LAB — TROPONIN I
Troponin I: <0.03 ng/mL (ref <0.03)
Troponin I: <0.03 ng/mL (ref <0.03)

## 2018-11-24 LAB — BASIC METABOLIC PANEL
Anion gap: 12 (ref 5–15)
BUN: 13 mg/dL (ref 6–20)
CO2: 19 mmol/L — ABNORMAL LOW (ref 22–32)
Calcium: 9.1 mg/dL (ref 8.9–10.3)
Chloride: 110 mmol/L (ref 98–111)
Creatinine, Ser: 0.69 mg/dL (ref 0.44–1.00)
GFR calc Af Amer: >60 mL/min (ref >60)
GFR calc non Af Amer: >60 mL/min (ref >60)
Glucose, Bld: 103 mg/dL — ABNORMAL HIGH (ref 70–99)
Potassium: 3.7 mmol/L (ref 3.5–5.1)
Sodium: 141 mmol/L (ref 135–145)

## 2018-11-24 NOTE — ED Provider Notes (Signed)
[facility] EMERGENCY DEPARTMENT Provider Note   CSN: [id number] Arrival date & time: 11/24/18  0139    History   Chief Complaint Chief Complaint  Patient presents with   Chest Pain    HPI Theresa Burke is a 38 y.o. female.  HPI: A 38 year old patient with a history of obesity presents for evaluation of chest pain. Initial onset of pain was approximately 1-3 hours ago. The patient's chest pain is not worse with exertion. The patient reports some diaphoresis. The patient's chest pain is middle- or left-sided, is not well-localized, is not described as heaviness/pressure/tightness, is not sharp and does not radiate to the arms/jaw/neck. The patient does not complain of nausea. The patient has no history of stroke, has no history of peripheral artery disease, has not smoked in the past 90 days, denies any history of treated diabetes, has no relevant family history of coronary artery disease (first degree relative at less than age 77), is not hypertensive and has no history of hypercholesterolemia.   Patient presents to the emergency department for evaluation of chest pain.  Patient had onset of central chest pain that she describes as a throbbing approximately 45 minutes before coming to the ER.  She reports that it was maximal severity at onset and has slowly improved.  Initially she had mild shortness of breath and diaphoresis associated with the chest pain.  She has never had similar symptoms before.  She does have a history of anxiety and panic attack, but feels that this feels different.  Her only cardiac risk factor is obesity, no hypertension, high cholesterol, smoking, family history.      PMH: Anxiety  OB History   No obstetric history on file.      Home Medications    Prior to Admission medications   Not on File  no meds  Family History History reviewed. No pertinent family history. No cardiac disease  Social History Social History   Tobacco Use    Smoking status: Not on file   Smokeless tobacco: Never Used  Substance Use Topics   Alcohol use: Yes   Drug use: Never     Allergies   Patient has no known allergies.   Review of Systems Review of Systems  Constitutional: Positive for diaphoresis.  Respiratory: Positive for shortness of breath.   Cardiovascular: Positive for chest pain.  All other systems reviewed and are negative.    Physical Exam Updated Vital Signs BP 99/63 (BP Location: Right Arm)    Pulse (!) 58    Temp 97.9 F (36.6 C) (Oral)    Resp 15    Ht 5\' 4"  (1.626 m)    Wt 93 kg    SpO2 100%    BMI 35.19 kg/m   Physical Exam Vitals signs and nursing note reviewed.  Constitutional:      General: She is not in acute distress.    Appearance: Normal appearance. She is well-developed.  HENT:     Head: Normocephalic and atraumatic.     Right Ear: Hearing normal.     Left Ear: Hearing normal.     Nose: Nose normal.  Eyes:     Conjunctiva/sclera: Conjunctivae normal.     Pupils: Pupils are equal, round, and reactive to light.  Neck:     Musculoskeletal: Normal range of motion and neck supple.  Cardiovascular:     Rate and Rhythm: Regular rhythm.     Heart sounds: S1 normal and S2 normal. No murmur. No friction  rub. No gallop.   Pulmonary:     Effort: Pulmonary effort is normal. No respiratory distress.     Breath sounds: Normal breath sounds.  Chest:     Chest wall: Tenderness present.    Abdominal:     General: Bowel sounds are normal.     Palpations: Abdomen is soft.     Tenderness: There is no abdominal tenderness. There is no guarding or rebound. Negative signs include Murphy's sign and McBurney's sign.     Hernia: No hernia is present.  Musculoskeletal: Normal range of motion.  Skin:    General: Skin is warm and dry.     Findings: No rash.  Neurological:     Mental Status: She is alert and oriented to person, place, and time.     GCS: GCS eye subscore is 4. GCS verbal subscore is 5. GCS  motor subscore is 6.     Cranial Nerves: No cranial nerve deficit.     Sensory: No sensory deficit.     Coordination: Coordination normal.  Psychiatric:        Speech: Speech normal.        Behavior: Behavior normal.        Thought Content: Thought content normal.      ED Treatments / Results  Labs (all labs ordered are listed, but only abnormal results are displayed) Labs Reviewed  BASIC METABOLIC PANEL - Abnormal; Notable for the following components:      Result Value   CO2 19 (*)    Glucose, Bld 103 (*)    All other components within normal limits  CBC  TROPONIN I  TROPONIN I    EKG None  Radiology Dg Chest 2 View  Result Date: 11/24/2018 CLINICAL DATA:  Chest pain for 1 hour EXAM: CHEST - 2 VIEW COMPARISON:  None. FINDINGS: The heart size and mediastinal contours are within normal limits. Both lungs are clear. The visualized skeletal structures are unremarkable. IMPRESSION: No active cardiopulmonary disease. Electronically Signed   By: Alcide CleverMark  Lukens M.D.   On: 11/24/2018 02:45    Procedures Procedures (including critical care time)  Medications Ordered in ED Medications - No data to display   Initial Impression / Assessment and Plan / ED Course  I have reviewed the triage vital signs and the nursing notes.  Pertinent labs & imaging results that were available during my care of the patient were reviewed by me and considered in my medical decision making (see chart for details).     HEAR Score: 2  Patient presents with atypical chest pain and minimal cardiac risk factors. HEART score 2, low risk. Patient had unrevealing workup including EKG and troponin x2, felt to be non-cardiac etiology. Patient to follow up with PCP, return if symptoms worsen.  Final Clinical Impressions(s) / ED Diagnoses   Final diagnoses:  Acute chest pain    ED Discharge Orders    None       Gilda CreasePollina, Tanielle Emigh J, MD 11/25/18 [phone]

## 2018-11-24 NOTE — ED Notes (Signed)
Pt refusing vital signs at this time.    

## 2018-11-24 NOTE — ED Notes (Signed)
Patient removed all monitoring equipment. 

## 2018-11-24 NOTE — Discharge Instructions (Signed)
Ibuprofen 600 mg every 6 hours as needed for pain.  Follow-up with cardiology if you would like to discuss a stress test.  The contact information for the [facility] cardiology clinic has been provided in this discharge summary for you to call and make these arrangements.  Return to the emergency department in the meantime if your symptoms worsen or change.

## 2018-11-24 NOTE — ED Notes (Signed)
Patient verbalizes understanding of discharge instructions. Opportunity for questioning and answers were provided. Armband removed by staff, pt discharged from ED home via POV.  

## 2018-11-24 NOTE — ED Notes (Signed)
Pt still refusing vital signs 

## 2018-11-24 NOTE — ED Triage Notes (Signed)
Patient c/o CP when lying down that started about 45 mins ago. States also having anxiety. Took 324 asa PTA.

## 2018-11-24 NOTE — ED Provider Notes (Signed)
Care assumed from Dr. Blinda Leatherwood at shift change.  Patient awaiting results of a second troponin.  This is returned and is unremarkable.  Patient has had intermittent chest discomfort for the past month.  Patient will be discharged today and will be given the follow-up information for the cardiology clinic with whom she can follow-up to discuss a stress test.  She understands to return in the meantime if symptoms worsen or change.   Geoffery Lyons, MD 11/24/18 [phone]

## 2018-11-25 ENCOUNTER — Encounter (HOSPITAL_COMMUNITY): Payer: Self-pay

## 2018-11-25 ENCOUNTER — Other Ambulatory Visit: Payer: Self-pay

## 2018-11-25 ENCOUNTER — Ambulatory Visit (HOSPITAL_COMMUNITY)
Admission: EM | Admit: 2018-11-25 | Discharge: 2018-11-25 | Disposition: A | Discharge status: Home / Self Care | Payer: Self-pay | Attending: Family Medicine | Admitting: Family Medicine

## 2018-11-25 DIAGNOSIS — F419 Anxiety disorder, unspecified: Secondary | ICD-10-CM

## 2018-11-25 MED ORDER — BUSPIRONE HCL 7.5 MG PO TABS: 7.5000 mg | ORAL_TABLET | Freq: Two times a day (BID) | ORAL | 0 refills | Status: DC

## 2018-11-25 NOTE — ED Provider Notes (Signed)
[facility]    CSN: [id number] Arrival date & time: 11/25/18  1224     History   Chief Complaint Chief Complaint  Patient presents with   Anxiety    HPI Theresa Burke is a 38 y.o. female.   HPI  Patient states she is been having increasing anxiety for the last month.  She has an underlying feeling of anxiety that is pervasive every day.  She is moved out of her home where she lives alone, is staying with her sister.  She states she is afraid to live alone.  She is not sleeping well.  She is eating sporadically.  She is not drinking alcohol.  She is not working.  She is trying to take a walk every day.  She does not have a specific trigger that she can attribute this to except for the COVID-19 pandemic and all the changes in her once lives.  She states that she has had some anxiety in the past but is never required treatment for it.  does not have tearfulness or feel depressed.  Weight is unchanged.  No thoughts of harming self and others  Past Medical History:  Diagnosis Date   Anxiety     There are no active problems to display for this patient.   Past Surgical History:  Procedure Laterality Date   NO PAST SURGERIES      OB History   No obstetric history on file.      Home Medications    Prior to Admission medications   Medication Sig Start Date End Date Taking? Authorizing Provider  busPIRone (BUSPAR) 7.5 MG tablet Take 1 tablet (7.5 mg total) by mouth 2 (two) times daily. May increase to 3 x a day.  May double dose. 11/25/18   Eustace MooreNelson, Grabiela Wohlford Sue, MD  ibuprofen (ADVIL,MOTRIN) 600 MG tablet Take 1 tablet (600 mg total) by mouth every 6 (six) hours as needed for moderate pain. 03/15/15   Fayrene Helperran, [place], PA-C    Family History Family History  Problem Relation Age of Onset   Cancer Mother     Social History Social History   Tobacco Use   Smoking status: Former Smoker   Smokeless tobacco: Never Used  Substance Use Topics   Alcohol use: Yes   Comment: social   Drug use: Never     Allergies   Patient has no known allergies.   Review of Systems Review of Systems  Constitutional: Negative for chills and fever.  HENT: Negative for ear pain and sore throat.   Eyes: Negative for pain and visual disturbance.  Respiratory: Positive for chest tightness. Negative for cough and shortness of breath.   Cardiovascular: Negative for chest pain and palpitations.  Gastrointestinal: Negative for abdominal pain and vomiting.  Genitourinary: Negative for dysuria and hematuria.  Musculoskeletal: Negative for arthralgias and back pain.  Skin: Negative for color change and rash.  Neurological: Negative for seizures and syncope.  Psychiatric/Behavioral: Positive for sleep disturbance. Negative for behavioral problems and dysphoric mood. The patient is nervous/anxious.   All other systems reviewed and are negative.    Physical Exam Triage Vital Signs ED Triage Vitals  Enc Vitals Group     BP 11/25/18 1240 120/84     Pulse Rate 11/25/18 1240 78     Resp 11/25/18 1240 17     Temp 11/25/18 1240 98.4 F (36.9 C)     Temp Source 11/25/18 1240 Oral     SpO2 11/25/18 1240 100 %  No data found.  Updated Vital Signs BP 120/84 (BP Location: Left Arm)    Pulse 78    Temp 98.4 F (36.9 C) (Oral)    Resp 17    SpO2 100%      Physical Exam Constitutional:      General: She is not in acute distress.    Appearance: She is well-developed. She is obese. She is not ill-appearing.  HENT:     Head: Normocephalic and atraumatic.  Eyes:     Conjunctiva/sclera: Conjunctivae normal.     Pupils: Pupils are equal, round, and reactive to light.  Neck:     Musculoskeletal: Normal range of motion.  Cardiovascular:     Rate and Rhythm: Normal rate.  Pulmonary:     Effort: Pulmonary effort is normal. No respiratory distress.  Abdominal:     General: There is no distension.     Palpations: Abdomen is soft.  Musculoskeletal: Normal range of motion.   Skin:    General: Skin is warm and dry.  Neurological:     General: No focal deficit present.     Mental Status: She is alert.  Psychiatric:        Mood and Affect: Mood normal.        Behavior: Behavior normal.     Comments: Lucid.  Normal conversation      UC Treatments / Results  Labs (all labs ordered are listed, but only abnormal results are displayed) Labs Reviewed - No data to display  EKG None  Radiology   Procedures Procedures (including critical care time)  Medications Ordered in UC Medications - No data to display  Initial Impression / Assessment and Plan / UC Course  I have reviewed the triage vital signs and the nursing notes.  Pertinent labs & imaging results that were available during my care of the patient were reviewed by me and considered in my medical decision making (see chart for details).     Discussed that the chronic management of generalized anxiety disorder requires long-term medications that are not appropriate for prescription through an  urgent care center Will give trial of buspirone Will place appropriate referrals Final Clinical Impressions(s) / UC Diagnoses   Final diagnoses:  Anxiety     Discharge Instructions     Try to eat well balanced diet and taking care of your self Drink plenty of water Avoid caffeine Take buspirone 2 times a day.  If this is not strong enough may increase to 3 times a day. I am giving you the phone number for primary care doctor. Giving you resources for mental health and counseling providers   ED Prescriptions    Medication Sig Dispense Auth. Provider   busPIRone (BUSPAR) 7.5 MG tablet Take 1 tablet (7.5 mg total) by mouth 2 (two) times daily. May increase to 3 x a day.  May double dose. 30 tablet Eustace Moore, MD     Controlled Substance Prescriptions [place] Controlled Substance Registry consulted? Not Applicable   Eustace Moore, MD 11/25/18 1335

## 2018-11-25 NOTE — Discharge Instructions (Signed)
Try to eat well balanced diet and taking care of your self Drink plenty of water Avoid caffeine Take buspirone 2 times a day.  If this is not strong enough may increase to 3 times a day. I am giving you the phone number for primary care doctor. Giving you resources for mental health and counseling providers

## 2018-11-25 NOTE — ED Triage Notes (Signed)
Patient presents to Urgent Care with complaints of ongoing anxiety since a month ago. Patient reports she was given atarax in the ED and it has not helped, would like different medication today. Pt does not have a PCP.

## 2018-11-28 ENCOUNTER — Telehealth: Payer: Self-pay | Admitting: Cardiology

## 2018-12-01 ENCOUNTER — Ambulatory Visit (INDEPENDENT_AMBULATORY_CARE_PROVIDER_SITE_OTHER): Payer: Self-pay | Admitting: Cardiology

## 2018-12-01 ENCOUNTER — Other Ambulatory Visit: Payer: Self-pay

## 2018-12-01 ENCOUNTER — Emergency Department (HOSPITAL_COMMUNITY): Payer: Self-pay

## 2018-12-01 ENCOUNTER — Emergency Department (HOSPITAL_COMMUNITY)
Admission: EM | Admit: 2018-12-01 | Discharge: 2018-12-01 | Disposition: A | Discharge status: Home / Self Care | Payer: Self-pay | Attending: Emergency Medicine | Admitting: Emergency Medicine

## 2018-12-01 ENCOUNTER — Encounter: Payer: Self-pay | Admitting: Cardiology

## 2018-12-01 VITALS — BP 120/82 | HR 78 | Temp 98.4°F | Ht 64.0 in | Wt 237.0 lb

## 2018-12-01 DIAGNOSIS — R0789 Other chest pain: Secondary | ICD-10-CM | POA: Insufficient documentation

## 2018-12-01 DIAGNOSIS — R12 Heartburn: Secondary | ICD-10-CM | POA: Insufficient documentation

## 2018-12-01 DIAGNOSIS — Z79899 Other long term (current) drug therapy: Secondary | ICD-10-CM | POA: Insufficient documentation

## 2018-12-01 DIAGNOSIS — Z87891 Personal history of nicotine dependence: Secondary | ICD-10-CM | POA: Insufficient documentation

## 2018-12-01 LAB — BASIC METABOLIC PANEL
Anion gap: 9 (ref 5–15)
BUN: 10 mg/dL (ref 6–20)
CO2: 24 mmol/L (ref 22–32)
Calcium: 9 mg/dL (ref 8.9–10.3)
Chloride: 108 mmol/L (ref 98–111)
Creatinine, Ser: 0.71 mg/dL (ref 0.44–1.00)
GFR calc Af Amer: >60 mL/min (ref >60)
GFR calc non Af Amer: >60 mL/min (ref >60)
Glucose, Bld: 93 mg/dL (ref 70–99)
Potassium: 3.3 mmol/L — ABNORMAL LOW (ref 3.5–5.1)
Sodium: 141 mmol/L (ref 135–145)

## 2018-12-01 LAB — CBC
HCT: 38.5 % (ref 36.0–46.0)
Hemoglobin: 12.2 g/dL (ref 12.0–15.0)
MCH: 30.1 pg (ref 26.0–34.0)
MCHC: 31.7 g/dL (ref 30.0–36.0)
MCV: 95.1 fL (ref 80.0–100.0)
Platelets: 274 10*3/uL (ref 150–400)
RBC: 4.05 MIL/uL (ref 3.87–5.11)
RDW: 13.2 % (ref 11.5–15.5)
WBC: 4.1 10*3/uL (ref 4.0–10.5)
nRBC: 0 % (ref 0.0–0.2)

## 2018-12-01 LAB — TROPONIN I
Troponin I: <0.03 ng/mL (ref <0.03)
Troponin I: <0.03 ng/mL (ref <0.03)

## 2018-12-01 LAB — TSH: TSH: 2.314 u[IU]/mL (ref 0.350–4.500)

## 2018-12-01 MED ORDER — FAMOTIDINE 40 MG PO TABS: 40.0000 mg | ORAL_TABLET | Freq: Every day | ORAL | 1 refills | Status: DC

## 2018-12-01 MED ORDER — PREDNISONE 10 MG PO TABS: ORAL_TABLET | ORAL | 0 refills | Status: AC

## 2018-12-01 MED ORDER — ACETAMINOPHEN 325 MG PO TABS
650.0000 mg | ORAL_TABLET | Freq: Once | ORAL | Status: AC
  Administered 2018-12-01: 650 mg via ORAL
  Filled 2018-12-01: qty 2

## 2018-12-01 MED ORDER — FAMOTIDINE 20 MG PO TABS: 20.0000 mg | ORAL_TABLET | Freq: Two times a day (BID) | ORAL | 0 refills | Status: DC

## 2018-12-01 MED ORDER — LIDOCAINE VISCOUS HCL 2 % MT SOLN
15.0000 mL | Freq: Once | OROMUCOSAL | Status: AC
  Administered 2018-12-01: 15 mL via ORAL
  Filled 2018-12-01: qty 15

## 2018-12-01 MED ORDER — ALUM & MAG HYDROXIDE-SIMETH 200-200-20 MG/5ML PO SUSP
30.0000 mL | Freq: Once | ORAL | Status: AC
Start: 2018-12-01 — End: 2018-12-01
  Administered 2018-12-01: 30 mL via ORAL
  Filled 2018-12-01: qty 30

## 2018-12-01 MED ORDER — SODIUM CHLORIDE 0.9% FLUSH
3.0000 mL | Freq: Once | INTRAVENOUS | Status: AC
  Administered 2018-12-01: 3 mL via INTRAVENOUS

## 2018-12-01 NOTE — Assessment & Plan Note (Signed)
Reproducible on exam & also made worse with food / lying down, but not with exertion.  Low risk for CAD.  Plan GXT - for reassurance Prednisone taper for ? Costochondritis (12 day - 60mg , 40mg , 20mg , 10mg  mg 3 d each) Famotidine 20 mg BID x 6 weeks

## 2018-12-01 NOTE — Progress Notes (Signed)
PCP: Patient, No Pcp Per  Clinic Note: Chief Complaint  Patient presents with  . Hospitalization Follow-up    Emergency room  . Chest Pain    HPI: Theresa Burke is a 38 y.o. obese woman with history of anxiety (former smoker-quit 2 weeks ago) who presents today for ER follow-up after multiple ER visits for chest discomfort.Theresa Rosser.  Theresa Burke was evaluated in the emergency room on April 16, Nov 24, 2018 ninth and earlier this morning on June 4.  Recent Hospitalizations: Multiple ER visits  May 28: ER visit for acute chest pain.  Throbbing mild to left-sided chest pain lasting 45 minutes.  Mild shortness of breath.  No radiation of pain. -->  Ruled out for MI.  Relatively normal EKG.  May 29: Increasing anxiety over the last month.  Had a move out of her home to live with her sister.  Afraid to live alone.  Very concerned about COVID-19.  Apparently had been started on antianxiety medication.  This morning she went to the ER noted retrosternal burning pain that she says is been going on for 2 weeks.  Sometimes wakes up coughing at night.  Made worse with food  Studies Personally Reviewed - (if available, images/films reviewed: From Epic Chart or Care Everywhere)  None  Interval History: Theresa Burke is here today after going to the emergency room early this morning for evaluation of this chest discomfort.  She describes it as a burning sensation in the center of her chest but also along the right side of her chest that up until a few hours ago was there pretty much all morning long.  It tends to come and go at times.  She started feeling better before she got in here and then upon arrival here she started feeling discomfort again upon examination.  She says it can get worse with coughing it oftentimes is lying down.  It does not increase with exertion unless she is breathing heavy.  It is also made worse with certain foods. When she has these episodes that take her to emergency room she definitely  feels a sense of jitteriness and anxiousness.   She really does not complain of any exertional chest discomfort, but does get little short of breath and walk around some because of being out of shape.  No PND, orthopnea with trivial end of day edema. Some mild lightheadedness when she is having her symptoms, but not usually.  No rapid irregular heartbeats or palpitations. No syncope/near syncope, TIA/amaurosis fugax symptoms. No melena, hematochezia, hematuria, or epstaxis. No claudication.  ROS: A comprehensive was performed. Review of Systems  Constitutional: Negative for chills, fever, malaise/fatigue and weight loss.  HENT: Negative for congestion, nosebleeds and sinus pain.   Respiratory: Positive for cough (Off and on but not much now.). Negative for shortness of breath.   Cardiovascular: Positive for chest pain (Per HPI).  Gastrointestinal: Positive for heartburn and nausea. Negative for abdominal pain, blood in stool, constipation, melena and vomiting.  Genitourinary: Negative for hematuria.  Musculoskeletal: Negative for back pain, falls and joint pain.  Neurological: Positive for headaches. Negative for dizziness, focal weakness and weakness.  Psychiatric/Behavioral: The patient is nervous/anxious and has insomnia (Often wakes up at night, scared.).   All other systems reviewed and are negative.   The patient does not have symptoms concerning for COVID-19 infection (fever, chills, cough, or new shortness of breath).  The patient is practicing social distancing.   COVID-19 Education: The signs and symptoms of COVID-19  were discussed with the patient and how to seek care for testing (follow up with PCP or arrange E-visit).   The importance of social distancing was discussed today.   I have reviewed and (if needed) personally updated the patient's problem list, medications, allergies, past medical and surgical history, social and family history.   Past Medical History:   Diagnosis Date  . Anxiety     Past Surgical History:  Procedure Laterality Date  . NO PAST SURGERIES      Current Meds  Medication Sig  . busPIRone (BUSPAR) 7.5 MG tablet Take 1 tablet (7.5 mg total) by mouth 2 (two) times daily. May increase to 3 x a day.  May double dose.    No Known Allergies  Social History   Tobacco Use  . Smoking status: Former Games developer  . Smokeless tobacco: Never Used  Substance Use Topics  . Alcohol use: Yes    Comment: social  . Drug use: Never   Social History   Social History Narrative   ** Merged History Encounter **        family history includes Cancer in her mother; Other in an other family member. doesn't really know - but no h/o CAD, HLD HTN or DM.   Wt Readings from Last 3 Encounters:  12/01/18 237 lb (107.5 kg)  12/01/18 202 lb 13.2 oz (92 kg)  11/24/18 205 lb (93 kg)    PHYSICAL EXAM BP 120/82 (BP Location: Right Arm, Patient Position: Sitting, Cuff Size: Large)   Pulse 78   Temp 98.4 F (36.9 C)   Ht  (1.626 m)   Wt 237 lb (107.5 kg)   LMP 11/25/2018   SpO2 97%   BMI 40.68 kg/m  Physical Exam  Constitutional: She appears well-developed and well-nourished. No distress.  Morbidly obese.  Well-groomed  HENT:  Head: Normocephalic and atraumatic.  Wearing mask  Eyes: Pupils are equal, round, and reactive to light. Conjunctivae and EOM are normal. No scleral icterus.  Neck: Normal range of motion. Neck supple. No JVD present. No tracheal deviation present.  Cardiovascular: Normal rate, regular rhythm, S1 normal, S2 normal and normal pulses. PMI is not displaced (Difficult to palpate). Exam reveals distant heart sounds. Exam reveals no gallop and no friction rub.  No murmur heard. Vitals reviewed.    Reproducible both sides of sternum from clavicle to mid breast otw normal   Adult ECG Report From ER  Rate: 77 ;  Rhythm: normal sinus rhythm and normal axis /intervals & durations  Narrative Interpretation: normal    Other studies Reviewed: Additional studies/ records that were reviewed today include:  Recent Labs:   Lab Results  Component Value Date   CREATININE 0.71 12/01/2018   BUN 10 12/01/2018   NA 141 12/01/2018   K 3.3 (L) 12/01/2018   CL 108 12/01/2018   CO2 24 12/01/2018   Lab Results  Component Value Date   CKTOTAL 305 (H) 02/13/2018   TROPONINI <0.03 12/01/2018   No results found for: CHOL, HDL, LDLCALC, LDLDIRECT, TRIG, CHOLHDL   ASSESSMENT / PLAN: Problem List Items Addressed This Visit    Chest wall pain - Primary    Reproducible on exam & also made worse with food / lying down, but not with exertion.  Low risk for CAD.  Plan GXT - for reassurance Prednisone taper for ? Costochondritis (12 day - , , ,  mg 3 d each) Famotidine 20 mg BID x 6 weeks  Relevant Orders   EXERCISE TOLERANCE TEST (ETT)      I spent a total of 25 minutes with the patient and chart review, 15 min in chart review & charting. >  50% of the total time was spent in direct patient consultation.   Current medicines are reviewed at length with the patient today.  (+/- concerns) n/s The following changes have been made:  see below  Patient Instructions  Medication Instructions:  TAKE FAMOTIDINE 20 MG TWICE A DAY FOR 6 WEEKS  PREDNISONE TAPER MEDICATION PREDNISONE 60 MG  FOR 3 DAYS,THEN TAKE 40 MG FOR 3 DAYS, 20 MG FOR 3 DAYS, THEN 10 MG FOR THREE DAYS THEN STOP.   If you need a refill on your cardiac medications before your next appointment, please call your pharmacy.   Lab work: n/a   Testing/Procedures: WILL SCHEDULE AT [address] BEFORE THIS TEST IS SCHEDULE YOU WILL HAVE TO TAKE COVID TEST INSTRUCTION WILL BE GIVEN ONCE EXERCISE TOLERANCE TEST IS SCHEDULE. Your physician has requested that you have an exercise tolerance test. For further information please visit [email or web address] Please also follow instruction sheet, as given.     Follow-Up: . You will need a follow up appointment in 6 wks  July 17 AT 9:40 AM.  Please call our office 2 months in advance to schedule this appointment.  You may see Bryan Lemma, MD or one of the following Advanced Practice Providers on your designated Care Team:  Theodore Demark, PA-C, Joni Reining, DNP, ANP  Any Other Special Instructions Will Be Listed Below (If Applicable).    Studies Ordered:   Orders Placed This Encounter  Procedures  . EXERCISE TOLERANCE TEST (ETT)      Bryan Lemma, M.D., M.S. Interventional Cardiologist   Pager # [phone] Phone # [phone] [address] [place], [place] [address]   Thank you for choosing [facility] at [facility]!!

## 2018-12-01 NOTE — ED Triage Notes (Signed)
Pt states she has had mid central CP , that feels like a burning sensation that has been going on and off since April ; pt states she has been seen before and was told everything was normal; pt further states she has a appt with a cardiologist today at 130 pm ; pt denies any sob

## 2018-12-01 NOTE — ED Provider Notes (Signed)
[facility] EMERGENCY DEPARTMENT Provider Note   CSN: [id number] Arrival date & time: 12/01/18  0753    History   Chief Complaint Chief Complaint  Patient presents with   Chest Pain    HPI Theresa Burke is a 38 y.o. female with no significant past medical history who presents the emergency department with chief complaint of burning chest pain.  Patient complains of burning retrosternal pain that has been going on for 2 weeks.  She states that it is worse at night.  She also has a sensation of feeling like it is difficult to swallow.  She states that at times she wakes up coughing at night.  She states the coughing is dry.  She does have a smoking history but quit 2 months ago.  The patient is anxious and has been seen at the urgent care and was started on antianxiety medication.  She does complain that she has decreased appetite.  Foods do seem to make it worse at times.  She denies any history of hypertension, hyperlipidemia.  She has no family history of MI or stroke.  She denies unilateral leg swelling, calf pain, history of DVT or PE, use of exogenous estrogens.  She has no known history of esophageal reflux and does not take any medications. HPI: A 38 year old patient with a history of obesity presents for evaluation of chest pain. Initial onset of pain was more than 6 hours ago. The patient's chest pain is not worse with exertion. The patient's chest pain is middle- or left-sided, is not well-localized, is not described as heaviness/pressure/tightness, is not sharp and does not radiate to the arms/jaw/neck. The patient does not complain of nausea and denies diaphoresis. The patient has smoked in the past 90 days. The patient has no history of stroke, has no history of peripheral artery disease, denies any history of treated diabetes, has no relevant family history of coronary artery disease (first degree relative at less than age 45), is not hypertensive and has no history of  hypercholesterolemia.   HPI  Past Medical History:  Diagnosis Date   Anxiety     There are no active problems to display for this patient.   Past Surgical History:  Procedure Laterality Date   NO PAST SURGERIES       OB History   No obstetric history on file.      Home Medications    Prior to Admission medications   Medication Sig Start Date End Date Taking? Authorizing Provider  acetaminophen (TYLENOL) 325 MG tablet Take 650 mg by mouth every 6 (six) hours as needed for headache.   Yes [provider]  busPIRone (BUSPAR) 7.5 MG tablet Take 1 tablet (7.5 mg total) by mouth 2 (two) times daily. May increase to 3 x a day.  May double dose. 11/25/18  Yes Eustace Moore, MD  ibuprofen (ADVIL,MOTRIN) 600 MG tablet Take 1 tablet (600 mg total) by mouth every 6 (six) hours as needed for moderate pain. Patient not taking: Reported on 12/01/2018 03/15/15   Fayrene Helper, PA-C    Family History Family History  Problem Relation Age of Onset   Cancer Mother     Social History Social History   Tobacco Use   Smoking status: Former Smoker   Smokeless tobacco: Never Used  Substance Use Topics   Alcohol use: Yes    Comment: social   Drug use: Never     Allergies   Patient has no known allergies.  Review of Systems Review of Systems Ten systems reviewed and are negative for acute change, except as noted in the HPI.    Physical Exam Updated Vital Signs BP 114/84    Pulse 74    Temp 98 F (36.7 C) (Oral)    Resp 14    Ht 5\' 4"  (1.626 m)    Wt 92 kg    LMP 11/25/2018    SpO2 98%    BMI 34.81 kg/m   Physical Exam Vitals signs and nursing note reviewed.  Constitutional:      General: She is not in acute distress.    Appearance: She is well-developed. She is not diaphoretic.  HENT:     Head: Normocephalic and atraumatic.  Eyes:     General: No scleral icterus.    Conjunctiva/sclera: Conjunctivae normal.  Neck:     Musculoskeletal: Normal range of  motion.  Cardiovascular:     Rate and Rhythm: Normal rate and regular rhythm.     Heart sounds: Normal heart sounds. No murmur. No friction rub. No gallop.   Pulmonary:     Effort: Pulmonary effort is normal. No respiratory distress.     Breath sounds: Normal breath sounds.  Abdominal:     General: Bowel sounds are normal. There is no distension.     Palpations: Abdomen is soft. There is no mass.     Tenderness: There is no abdominal tenderness. There is no guarding.  Skin:    General: Skin is warm and dry.  Neurological:     Mental Status: She is alert and oriented to person, place, and time.  Psychiatric:        Mood and Affect: Mood is anxious.        Behavior: Behavior normal.      ED Treatments / Results  Labs (all labs ordered are listed, but only abnormal results are displayed) Labs Reviewed  CBC  BASIC METABOLIC PANEL  TROPONIN I  TSH    EKG EKG Interpretation  Date/Time:  Thursday December 01 2018 08:08:09 EDT Ventricular Rate:  77 PR Interval:    QRS Duration: 99 QT Interval:  396 QTC Calculation: 449 R Axis:   71 Text Interpretation:  Sinus rhythm Baseline wander No significant change since last tracing Confirmed by Alvira MondaySchlossman, Erin ([id number]) on 12/01/2018 8:30:40 AM   Radiology Dg Chest 2 View  Result Date: 12/01/2018 CLINICAL DATA:  Chest pain EXAM: CHEST - 2 VIEW COMPARISON:  October 13, 2018 FINDINGS: The degree of inspiration is shallow. There is no evident edema or consolidation. Heart is upper normal in size with pulmonary vascularity normal. No adenopathy. No bone lesions. IMPRESSION: Shallow degree of inspiration. No edema or consolidation. Heart upper normal in size. Electronically Signed   By: Bretta BangWilliam  Woodruff III M.D.   On: 12/01/2018 09:25    Procedures Procedures (including critical care time)  Medications Ordered in ED Medications  sodium chloride flush (NS) 0.9 % injection 3 mL (3 mLs Intravenous Given 12/01/18 0918)     Initial Impression /  Assessment and Plan / ED Course  I have reviewed the triage vital signs and the nursing notes.  Pertinent labs & imaging results that were available during my care of the patient were reviewed by me and considered in my medical decision making (see chart for details).     HEAR Score: 1  CC: Chest pain, cough, difficulty swallowing VS:  Vitals:   12/01/18 0900 12/01/18 1055 12/01/18 1100 12/01/18 1240  BP: 114/84 Marland Kitchen(!)  123/92 117/88 134/79  Pulse: 74 65 62 63  Resp: 14 (!) 21 (!) 23 15  Temp:      TempSrc:      SpO2: 98% 100% 97% 97%  Weight:      Height:       JK:DTOIZTI is gathered by patient and EMR. DDX:The emergent differential diagnosis of chest pain includes: Acute coronary syndrome, pericarditis, aortic dissection, pulmonary embolism, tension pneumothorax, pneumonia, and esophageal rupture. Labs: I reviewed the labs which show mild hypokalemia normal TSH, normal troponin BMP without abnormality.  Negative troponin Imaging: I personally reviewed the images (2 view chest x-ray) which show(s) no acute abnormalities EKG:  EKG Interpretation  Date/Time:  Thursday December 01 2018 08:08:09 EDT Ventricular Rate:  77 PR Interval:    QRS Duration: 99 QT Interval:  396 QTC Calculation: 449 R Axis:   71 Text Interpretation:  Sinus rhythm Baseline wander No significant change since last tracing Confirmed by Alvira Monday ([id number]) on 12/01/2018 8:30:40 AM Also confirmed by Alvira Monday ([id number]), editor Elita Quick ([id number])  on 12/01/2018 2:07:54 PM Also confirmed by Alvira Monday ([id number]), editor Elita Quick ([id number])  on 12/01/2018 2:08:17 PM       MDM: Patient with heart score of 1, she does not see in serial troponins.  I think that her symptoms are most consistent with reflux.  She is also having anxiety and is somewhat tearful.  Patient is PERC negative, feel that she is appropriate for discharge.  We will start her on Pepcid nightly gust return precautions.  Patient  disposition: Discharge Patient condition: Excellent. The patient appears reasonably screened and/or stabilized for discharge and I doubt any other medical condition or other [facility] requiring further screening, evaluation, or treatment in the ED at this time prior to discharge. I have discussed lab and/or imaging findings with the patient and answered all questions/concerns to the best of my ability. I have discussed return precautions and OP follow up.      Final Clinical Impressions(s) / ED Diagnoses   Final diagnoses:  Heartburn    ED Discharge Orders    None       Arthor Captain, PA-C 12/01/18 1644    Alvira Monday, MD 12/04/18 [phone]

## 2018-12-01 NOTE — Discharge Instructions (Addendum)

## 2018-12-01 NOTE — Patient Instructions (Addendum)
Medication Instructions:  TAKE FAMOTIDINE 20 MG TWICE A DAY FOR 6 WEEKS  PREDNISONE TAPER MEDICATION PREDNISONE 60 MG  FOR 3 DAYS,THEN TAKE 40 MG FOR 3 DAYS, 20 MG FOR 3 DAYS, THEN 10 MG FOR THREE DAYS THEN STOP.   If you need a refill on your cardiac medications before your next appointment, please call your pharmacy.   Lab work: n/a  If you have labs (blood work) drawn today and your tests are completely normal, you will receive your results only by: Marland Kitchen MyChart Message (if you have MyChart) OR . A paper copy in the mail If you have any lab test that is abnormal or we need to change your treatment, we will call you to review the results.  Testing/Procedures: WILL SCHEDULE AT [address] BEFORE THIS TEST IS SCHEDULE YOU WILL HAVE TO TAKE COVID TEST INSTRUCTION WILL BE GIVEN ONCE EXERCISE TOLERANCE TEST IS SCHEDULE. Your physician has requested that you have an exercise tolerance test. For further information please visit [email or web address] Please also follow instruction sheet, as given.    Follow-Up: At [facility], you and your health needs are our priority.  As part of our continuing mission to provide you with exceptional heart care, we have created designated Provider Care Teams.  These Care Teams include your primary Cardiologist (physician) and Advanced Practice Providers (APPs -  Physician Assistants and Nurse Practitioners) who all work together to provide you with the care you need, when you need it. . You will need a follow up appointment in 6 wks  July 17 AT 9:40 AM.  Please call our office 2 months in advance to schedule this appointment.  You may see Bryan Lemma, MD or one of the following Advanced Practice Providers on your designated Care Team:   . Theodore Demark, PA-C . Joni Reining, DNP, ANP  Any Other Special Instructions Will Be Listed Below (If Applicable).

## 2018-12-01 NOTE — ED Notes (Signed)
Light green tube and lavender tube sent to lab. Labels on tubes.

## 2018-12-05 ENCOUNTER — Encounter: Payer: Self-pay | Admitting: Cardiology

## 2018-12-06 ENCOUNTER — Other Ambulatory Visit: Payer: Self-pay | Admitting: *Deleted

## 2018-12-06 ENCOUNTER — Telehealth: Payer: Self-pay | Admitting: *Deleted

## 2018-12-06 NOTE — Telephone Encounter (Addendum)
Left detailed message on what to do about covid test  For December 12 2018. request the patient call back tomorrow to confirm she received message

## 2018-12-06 NOTE — Telephone Encounter (Signed)
Theresa Burke please schedule this patient for a GXT at 10:30 @ [facility] on Friday June 19th.   Ivin Booty please schedule Covid testing for Monday June 15th and have the patient self isolate after the test.   Thanks,  Robin   Left message for patient to call back - THIS INFORMATION  IS TO GIVEN TO PATIENT.   RN has  schedule a covid test on June 15,2020 at 11:30 am. Patient is to arrive at [facility] Once patient has covid test - self quarantine until she has  GXT  on Friday   June 19th

## 2018-12-07 ENCOUNTER — Encounter (HOSPITAL_COMMUNITY): Payer: Self-pay | Admitting: Emergency Medicine

## 2018-12-07 ENCOUNTER — Other Ambulatory Visit: Payer: Self-pay

## 2018-12-07 ENCOUNTER — Emergency Department (HOSPITAL_COMMUNITY)
Admission: EM | Admit: 2018-12-07 | Discharge: 2018-12-07 | Disposition: A | Discharge status: Home / Self Care | Payer: Self-pay | Attending: Emergency Medicine | Admitting: Emergency Medicine

## 2018-12-07 DIAGNOSIS — Z79899 Other long term (current) drug therapy: Secondary | ICD-10-CM | POA: Insufficient documentation

## 2018-12-07 DIAGNOSIS — L03011 Cellulitis of right finger: Secondary | ICD-10-CM | POA: Insufficient documentation

## 2018-12-07 NOTE — ED Triage Notes (Signed)
Per pt she come from home complaining of right middle finger pain 7/10 and central burning chest pain 5/10 that started an hour ago.  NAD noted at this time.  Pt does have history of anxiety and denies any cardiac history.  AOx4.

## 2018-12-07 NOTE — Discharge Instructions (Addendum)
Please read attached information. If you experience any new or worsening signs or symptoms please return to the emergency room for evaluation. Please follow-up with your primary care provider or specialist as discussed.  °

## 2018-12-07 NOTE — ED Provider Notes (Signed)
[facility] Provider Note   CSN: [id number] Arrival date & time: 12/07/18  1113    History   Chief Complaint Chief Complaint  Patient presents with  . Chest Pain    HPI Theresa Burke is a 38 y.o. female.     HPI  38 year old female presents today with complaints of finger infection.  Patient notes 3 days ago she noticed some pain and swelling along the medial nail fold of the right third digit.  She notes no purulence.  She denies any surrounding redness or significant discomfort.  No pain to the pad of the finger.  No history of the same.  She does not bite her fingers.  In triage patient also noted she still has ongoing chest burning.  She has been seen several times both in the emergency room and by cardiology.  She has had reassuring work-ups.  She notes this is a burning sensation worse after certain foods.  She notes this comes and goes.  She denies any shortness of breath or specific pain, no radiation.  She notes is identical to previous and is not here for complaint.  Past Medical History:  Diagnosis Date  . Anxiety     Patient Active Problem List   Diagnosis Date Noted  . Chest wall pain 12/01/2018    Past Surgical History:  Procedure Laterality Date  . NO PAST SURGERIES       OB History   No obstetric history on file.      Home Medications    Prior to Admission medications   Medication Sig Start Date End Date Taking? Authorizing Provider  busPIRone (BUSPAR) 7.5 MG tablet Take 1 tablet (7.5 mg total) by mouth 2 (two) times daily. May increase to 3 x a day.  May double dose. 11/25/18   Eustace MooreNelson, Yvonne Sue, MD  famotidine (PEPCID) 20 MG tablet Take 1 tablet (20 mg total) by mouth 2 (two) times daily. FOR 6 WEEKS THEN STOP 12/01/18   Marykay LexHarding, David W, MD  predniSONE (DELTASONE) 10 MG tablet Take 6 tablets (60 mg total) by mouth daily with breakfast for 3 days, THEN 4 tablets (40 mg total) daily with breakfast for 3 days, THEN  2 tablets (20 mg total) daily with breakfast for 3 days, THEN 1 tablet (10 mg total) daily with breakfast for 3 days. 12/01/18 12/13/18  Marykay LexHarding, David W, MD    Family History Family History  Problem Relation Age of Onset  . Cancer Mother   . Other Other        doesn't really know - but no h/o CAD, HLD HTN or DM.     Social History Social History   Tobacco Use  . Smoking status: Former Games developermoker  . Smokeless tobacco: Never Used  Substance Use Topics  . Alcohol use: Yes    Comment: social  . Drug use: Never     Allergies   Patient has no known allergies.   Review of Systems Review of Systems  All other systems reviewed and are negative.    Physical Exam Updated Vital Signs BP 126/82 (BP Location: Right Arm)   Pulse 82   Temp 97.6 F (36.4 C) (Axillary)   Resp 18   Ht 5\' 4"  (1.626 m)   Wt 99.8 kg   LMP 11/25/2018   SpO2 96%   BMI 37.76 kg/m   Physical Exam Vitals signs and nursing note reviewed.  Constitutional:      Appearance: She is well-developed.  HENT:     Head: Normocephalic and atraumatic.  Eyes:     General: No scleral icterus.       Right eye: No discharge.        Left eye: No discharge.     Conjunctiva/sclera: Conjunctivae normal.     Pupils: Pupils are equal, round, and reactive to light.  Neck:     Musculoskeletal: Normal range of motion.     Vascular: No JVD.     Trachea: No tracheal deviation.  Cardiovascular:     Rate and Rhythm: Normal rate and regular rhythm.  Pulmonary:     Effort: Pulmonary effort is normal. No respiratory distress.     Breath sounds: Normal breath sounds. No stridor.  Musculoskeletal:     Comments: Paronychia right third medial nail fold no surrounding erythema, pad at the finger nontender with no swelling  Neurological:     Mental Status: She is alert and oriented to person, place, and time.     Coordination: Coordination normal.  Psychiatric:        Behavior: Behavior normal.        Thought Content: Thought  content normal.        Judgment: Judgment normal.      ED Treatments / Results  Labs (all labs ordered are listed, but only abnormal results are displayed) Labs Reviewed - No data to display  EKG None  Radiology No results found.  Procedures Drain paronychia Date/Time: 12/07/2018 11:42 AM Performed by: Okey Regal, PA-C Authorized by: Okey Regal, PA-C  Consent: Verbal consent obtained. Risks and benefits: risks, benefits and alternatives were discussed Consent given by: patient Patient understanding: patient states understanding of the procedure being performed Patient identity confirmed: verbally with patient Local anesthesia used: Freeze spray.  Anesthesia: Local anesthesia used: Freeze spray.  Sedation: Patient sedated: no  Patient tolerance: Patient tolerated the procedure well with no immediate complications Comments: Paronychia to the right third medial nail fold single straight incision made with purulent drainage.  Patient tolerated procedure well without complications.    (including critical care time)  Medications Ordered in ED Medications - No data to display   Initial Impression / Assessment and Plan / ED Course  I have reviewed the triage vital signs and the nursing notes.  Pertinent labs & imaging results that were available during my care of the patient were reviewed by me and considered in my medical decision making (see chart for details).        Patient here with uncomplicated paronychia.  No surrounding erythema.  Successful incision and drainage.  Warm water soaks no antibiotics indicated at this time.  Patient will return if she develops any new or worsening signs or symptoms.  Patient also mentioned chest pain, she has no changes from her baseline, this is been worked up in depth with no significant abnormalities.  I have very low suspicion for ACS, this sounds like indigestion which she is currently being treated for.  No need for  further evaluation and management here in the ED.  EKG was ordered prior to my assessment, normal sinus rhythm with no significant abnormalities.  Final Clinical Impressions(s) / ED Diagnoses   Final diagnoses:  Paronychia of finger of right hand    ED Discharge Orders    None       Okey Regal, PA-C 12/07/18 [place], Wenda Overland, MD 12/07/18 1334

## 2018-12-07 NOTE — ED Notes (Signed)
Patient given discharge instructions and verbalized understanding.  Patient stable to discharge at this time.  Patient is alert and oriented to baseline.  No distressed noted at this time.  All belongings taken with the patient at discharge.   

## 2018-12-08 ENCOUNTER — Telehealth: Payer: Self-pay | Admitting: *Deleted

## 2018-12-08 NOTE — Telephone Encounter (Signed)
LEFT MESSAGE AGAIN FOR PATIENT TO CALL BACK

## 2018-12-08 NOTE — Telephone Encounter (Signed)
AGAIN LEFT DETAILED MESSAGE FOR PATIENT TO HAVE COVID TEST June 15 ,2020 AT 11:30 AM  AND TO SELF QUARANTINE UNTIL June 19  APPT FOR GXT .-- ALSO LEFT MESSAGE FOR PATIENT TO CAL BACK TO LET RN KNOW SHE RECEIVED MESSAGE.

## 2018-12-08 NOTE — Telephone Encounter (Signed)
OPEN ERROR

## 2018-12-10 ENCOUNTER — Other Ambulatory Visit: Payer: Self-pay

## 2018-12-10 ENCOUNTER — Emergency Department (HOSPITAL_COMMUNITY)
Admission: EM | Admit: 2018-12-10 | Discharge: 2018-12-11 | Disposition: A | Discharge status: Home / Self Care | Payer: Self-pay | Attending: Emergency Medicine | Admitting: Emergency Medicine

## 2018-12-10 ENCOUNTER — Emergency Department (HOSPITAL_COMMUNITY): Payer: Self-pay

## 2018-12-10 ENCOUNTER — Encounter (HOSPITAL_COMMUNITY): Payer: Self-pay | Admitting: Emergency Medicine

## 2018-12-10 DIAGNOSIS — K219 Gastro-esophageal reflux disease without esophagitis: Secondary | ICD-10-CM | POA: Insufficient documentation

## 2018-12-10 DIAGNOSIS — R1013 Epigastric pain: Secondary | ICD-10-CM | POA: Insufficient documentation

## 2018-12-10 DIAGNOSIS — Z87891 Personal history of nicotine dependence: Secondary | ICD-10-CM | POA: Insufficient documentation

## 2018-12-10 LAB — COMPREHENSIVE METABOLIC PANEL
ALT: 21 U/L (ref 0–44)
AST: 13 U/L — ABNORMAL LOW (ref 15–41)
Albumin: 3.3 g/dL — ABNORMAL LOW (ref 3.5–5.0)
Alkaline Phosphatase: 78 U/L (ref 38–126)
Anion gap: 10 (ref 5–15)
BUN: 8 mg/dL (ref 6–20)
CO2: 23 mmol/L (ref 22–32)
Calcium: 9 mg/dL (ref 8.9–10.3)
Chloride: 105 mmol/L (ref 98–111)
Creatinine, Ser: 0.62 mg/dL (ref 0.44–1.00)
GFR calc Af Amer: >60 mL/min (ref >60)
GFR calc non Af Amer: >60 mL/min (ref >60)
Glucose, Bld: 85 mg/dL (ref 70–99)
Potassium: 3.4 mmol/L — ABNORMAL LOW (ref 3.5–5.1)
Sodium: 138 mmol/L (ref 135–145)
Total Bilirubin: 0.5 mg/dL (ref 0.3–1.2)
Total Protein: 6.8 g/dL (ref 6.5–8.1)

## 2018-12-10 LAB — CBC
HCT: 38.5 % (ref 36.0–46.0)
Hemoglobin: 11.9 g/dL — ABNORMAL LOW (ref 12.0–15.0)
MCH: 29.8 pg (ref 26.0–34.0)
MCHC: 30.9 g/dL (ref 30.0–36.0)
MCV: 96.3 fL (ref 80.0–100.0)
Platelets: 290 10*3/uL (ref 150–400)
RBC: 4 MIL/uL (ref 3.87–5.11)
RDW: 14.2 % (ref 11.5–15.5)
WBC: 7.4 10*3/uL (ref 4.0–10.5)
nRBC: 0 % (ref 0.0–0.2)

## 2018-12-10 LAB — I-STAT BETA HCG BLOOD, ED (MC, WL, AP ONLY): I-stat hCG, quantitative: <5 m[IU]/mL (ref <5)

## 2018-12-10 LAB — TROPONIN I: Troponin I: <0.03 ng/mL (ref <0.03)

## 2018-12-10 MED ORDER — LIDOCAINE VISCOUS HCL 2 % MT SOLN
15.0000 mL | Freq: Once | OROMUCOSAL | Status: AC
  Administered 2018-12-10: 15 mL via ORAL
  Filled 2018-12-10: qty 15

## 2018-12-10 MED ORDER — ALUM & MAG HYDROXIDE-SIMETH 200-200-20 MG/5ML PO SUSP
30.0000 mL | Freq: Once | ORAL | Status: AC
  Administered 2018-12-10: 30 mL via ORAL
  Filled 2018-12-10: qty 30

## 2018-12-10 MED ORDER — FAMOTIDINE IN NACL 20-0.9 MG/50ML-% IV SOLN
20.0000 mg | Freq: Once | INTRAVENOUS | Status: AC
  Administered 2018-12-10: 23:00:00 20 mg via INTRAVENOUS
  Filled 2018-12-10: qty 50

## 2018-12-10 NOTE — ED Notes (Signed)
Pt refusing blood work and IV.

## 2018-12-10 NOTE — ED Triage Notes (Signed)
Pt arrived to ED with c/o chest pain. Pt reports 9/10 burning pain. Pain in epigastric area. Pt denies SOB. Pt in NAD at this time.

## 2018-12-10 NOTE — ED Triage Notes (Signed)
Pt brought to ED for c/o intermittent burning sensation on her chest, was seen last week for the same, symptoms started today after eating pasta for dinner. BP 130/89, HR 90, SPO 97% on RA. Temp 97.3

## 2018-12-10 NOTE — ED Provider Notes (Signed)
[facility]   CSN: [id number] Arrival date & time: 12/10/18  2127    History   Chief Complaint Chief Complaint  Patient presents with  . Gastroesophageal Reflux    HPI Theresa Burke is a 38 y.o. female with a pmh of anxiety. SHe presents with cc of burning epigastric pain which began after she ate pasta with tomato sauce this evening. She states that today she ate pasta with tomato sauce and afterward began having burning epigastric pain.  She states it is worse when she lies down, better when she sitting up.  She was seen by me here in the emergency department about 2 weeks ago.  She did follow-up the next day with cardiology and saw Dr. Herbie BaltimoreHarding.  She is still pending outpatient exercise stress test however she was started on Pepcid and a prednisone taper.  She states that since she took the prednisone taper her epigastric symptoms have worsened.  She does have obesity but she denies a history of hypertension, hyperlipidemia, high blood pressure, diabetes.  She has no family history of MI or CAD.  She does not smoke cigarettes.  States that she does see a pattern of this pain associated with eating.  She states that it does tend to last for several hours and self resolved.  She has no previous history of abdominal surgeries including cholecystectomy.  She denies diaphoresis, nausea, vomiting.  HPI: A [age] year old patient with a history of obesity presents for evaluation of chest pain. Initial onset of pain was approximately 1-3 hours ago. The patient's chest pain is not worse with exertion. The patient's chest pain is not middle- or left-sided, is not well-localized, is not described as heaviness/pressure/tightness, is not sharp and does not radiate to the arms/jaw/neck. The patient does not complain of nausea and denies diaphoresis. The patient has no history of stroke, has no history of peripheral artery disease, has not smoked in the past 90  days, denies any history of treated diabetes, has no relevant family history of coronary artery disease (first degree relative at less than age 40), is not hypertensive and has no history of hypercholesterolemia.   HPI  Past Medical History:  Diagnosis Date  . Anxiety     Patient Active Problem List   Diagnosis Date Noted  . Chest wall pain 12/01/2018    Past Surgical History:  Procedure Laterality Date  . NO PAST SURGERIES       OB History   No obstetric history on file.      Home Medications    Prior to Admission medications   Medication Sig Start Date End Date Taking? Authorizing Provider  busPIRone (BUSPAR) 7.5 MG tablet Take 1 tablet (7.5 mg total) by mouth 2 (two) times daily. May increase to 3 x a day.  May double dose. 11/25/18   Eustace MooreNelson, Yvonne Sue, MD  famotidine (PEPCID) 20 MG tablet Take 1 tablet (20 mg total) by mouth 2 (two) times daily. FOR 6 WEEKS THEN STOP 12/01/18   Marykay LexHarding, David W, MD  predniSONE (DELTASONE) 10 MG tablet Take 6 tablets (60 mg total) by mouth daily with breakfast for 3 days, THEN 4 tablets (40 mg total) daily with breakfast for 3 days, THEN 2 tablets (20 mg total) daily with breakfast for 3 days, THEN 1 tablet (10 mg total) daily with breakfast for 3 days. 12/01/18 12/13/18  Marykay LexHarding, David W, MD    Family History Family History  Problem Relation Age of Onset  .  Cancer Mother   . Other Other        doesn't really know - but no h/o CAD, HLD HTN or DM.     Social History Social History   Tobacco Use  . Smoking status: Former Games developermoker  . Smokeless tobacco: Never Used  Substance Use Topics  . Alcohol use: Yes    Comment: social  . Drug use: Never     Allergies   Patient has no known allergies.   Review of Systems Review of Systems Ten systems reviewed and are negative for acute change, except as noted in the HPI.    Physical Exam Updated Vital Signs BP 101/66   Pulse 76   Temp 97.6 F (36.4 C) (Axillary)   Resp 19   Ht 5\' 4"   (1.626 m)   Wt 95.3 kg   LMP 11/25/2018   SpO2 100%   BMI 36.05 kg/m   Physical Exam Vitals signs and nursing note reviewed.  Constitutional:      General: She is not in acute distress.    Appearance: She is well-developed. She is not diaphoretic.  HENT:     Head: Normocephalic and atraumatic.  Eyes:     General: No scleral icterus.    Conjunctiva/sclera: Conjunctivae normal.  Neck:     Musculoskeletal: Normal range of motion.  Cardiovascular:     Rate and Rhythm: Normal rate and regular rhythm.     Heart sounds: Normal heart sounds. No murmur. No friction rub. No gallop.   Pulmonary:     Effort: Pulmonary effort is normal. No respiratory distress.     Breath sounds: Normal breath sounds.  Abdominal:     General: Bowel sounds are normal. There is no distension.     Palpations: Abdomen is soft. There is no mass.     Tenderness: There is no abdominal tenderness. There is no guarding.     Comments: Negative Murphy sign  Skin:    General: Skin is warm and dry.  Neurological:     Mental Status: She is alert and oriented to person, place, and time.  Psychiatric:        Behavior: Behavior normal.     Comments: Tearful and anxious      ED Treatments / Results  Labs (all labs ordered are listed, but only abnormal results are displayed) Labs Reviewed  CBC - Abnormal; Notable for the following components:      Result Value   Hemoglobin 11.9 (*)    All other components within normal limits  COMPREHENSIVE METABOLIC PANEL - Abnormal; Notable for the following components:   Potassium 3.4 (*)    Albumin 3.3 (*)    AST 13 (*)    All other components within normal limits  TROPONIN I  I-STAT BETA HCG BLOOD, ED ([facility], [facility], [facility] ONLY)    EKG ECG interpretation   Date: 12/10/2018  Rate: 66  Rhythm: normal sinus rhythm  QRS Axis: normal  Intervals: normal  ST/T Wave abnormalities: normal  Conduction Disutrbances: none  Narrative Interpretation:   Old EKG Reviewed: No  significant changes noted    Radiology Dg Chest Port 1 View  Result Date: 12/10/2018 CLINICAL DATA:  Burning in the chest. EXAM: PORTABLE CHEST 1 VIEW COMPARISON:  12/01/2018 FINDINGS: Normal heart, mediastinum and hila. Lungs clear.  No pleural effusion or pneumothorax. Skeletal structures are unremarkable. IMPRESSION: No active disease. Electronically Signed   By: Amie Portlandavid  Ormond M.D.   On: 12/10/2018 22:05   [place] Abdomen Limited  Ruq  Result Date: 12/10/2018 CLINICAL DATA:  Acid reflux EXAM: ULTRASOUND ABDOMEN LIMITED RIGHT UPPER QUADRANT COMPARISON:  None. FINDINGS: Gallbladder: No gallstones or wall thickening visualized. No sonographic Murphy sign noted by sonographer. Common bile duct: Diameter: Normal caliber, 3 mm Liver: No focal lesion identified. Within normal limits in parenchymal echogenicity. Portal vein is patent on color Doppler imaging with normal direction of blood flow towards the liver. IMPRESSION: Normal right upper quadrant ultrasound. Electronically Signed   By: Rolm Baptise M.D.   On: 12/10/2018 23:46    Procedures Procedures (including critical care time)  Medications Ordered in ED Medications  famotidine (PEPCID) IVPB 20 mg premix (0 mg Intravenous Stopped 12/10/18 2332)  alum & mag hydroxide-simeth (MAALOX/MYLANTA) 200-200-20 MG/5ML suspension 30 mL (30 mLs Oral Given 12/10/18 2249)    And  lidocaine (XYLOCAINE) 2 % viscous mouth solution 15 mL (15 mLs Oral Given 12/10/18 2249)     Initial Impression / Assessment and Plan / ED Course  I have reviewed the triage vital signs and the nursing notes.  Pertinent labs & imaging results that were available during my care of the patient were reviewed by me and considered in my medical decision making (see chart for details).     HEAR Score: 1  CC: Epigastric abdominal pain described as burning VS:  Vitals:   12/10/18 2135 12/10/18 2145 12/10/18 2200 12/10/18 2329  BP: 132/76 115/72 101/66   Pulse: 82 65 64 76  Resp:  14 14 (!) 23 19  Temp:      TempSrc:      SpO2: 99% 99% 99% 100%  Weight: 95.3 kg     Height: 5\' 4"  (1.626 m)       TW:[id number] is gathered by patient and review of EMR. [id number] diagnosis of epigastric pain includes: Functional or nonulcer dyspepsia (MCC), PUD, GERD, Gastritis, (NSAIDs, alcohol, stress, H. pylori, pernicious anemia), pancreatitis or pancreatic cancer, overeating indigestion (high-fat foods, coffee), drugs (aspirin, antibiotics (eg, macrolides, metronidazole), corticosteroids, digoxin, narcotics, theophylline), gastroparesis, lactose intolerance, malabsorption gastric cancer, parasitic infection, (Giardia, Strongyloides, Ascaris) cholelithiasis, choledocholithiasis, or cholangitis, ACS, pericarditis, pneumonia, abdominal hernia, pregnancy, intestinal ischemia, esophageal rupture, gastric volvulus, hepatitis.  Labs: I reviewed the labs which show no significant abnormalities, mild normocytic anemia, mild hypokalemia, negative pregnancy and negative troponin. Imaging: I personally reviewed the images (ultrasound of the right upper quadrant of the abdomen which showed no evidence of: Cholelithiasis or cholecystitis, no biliary sludge or gallbladder wall thickening)  EKG: Normal sinus rhythm, normal EKG MDM: Patient here for the third time in several weeks with high anxiety about her medical conditions.  She is very low risk for any emergent issue.  She is tearful, fretful and anxious about her reflux symptoms.  Her heart score is 1.  I had a long conversation to try and reassure the patient that she is very low risk for any emergent cause of her symptoms.  Patient given a GI cocktail, Pepcid.  She has already established outpatient follow-up with cardiology and is going to have an outpatient stress test.  I have also given the patient a referral to gastroenterology for further work-up of her symptoms, patient given dietary food choices for reflux and return precautions. Patient  disposition: Discharge Patient condition: Good. The patient appears reasonably screened and/or stabilized for discharge and I doubt any other medical condition or other [facility] requiring further screening, evaluation, or treatment in the ED at this time prior to discharge. I have discussed lab and/or imaging findings  with the patient and answered all questions/concerns to the best of my ability. I have discussed return precautions and OP follow up.      Final Clinical Impressions(s) / ED Diagnoses   Final diagnoses:  Acid reflux  Epigastric pain    ED Discharge Orders    None       Arthor CaptainHarris, Kynisha Memon, PA-C 12/10/18 2359    Charlynne PanderYao, David Hsienta, MD 12/11/18 [phone]

## 2018-12-10 NOTE — Discharge Instructions (Signed)
1) please continue taking the Pepcid medication as directed. 2) please follow-up with Dr. Ellyn Hack and get your stress test. 3) Please read all the attached information and follow dietary guidelines to reduce your symptoms. 4) please follow-up with a gastroenterologist for further evaluation of your symptoms.  Get help right away if you: Have pain in your arms, neck, jaw, teeth, or back. Feel sweaty, dizzy, or light-headed. Have chest pain or shortness of breath. Vomit and your vomit looks like blood or coffee grounds. Faint. Have stool that is bloody or black. Cannot swallow, drink, or eat.

## 2018-12-11 NOTE — ED Notes (Signed)
Pt discharged with all belongings. Discharge instructions reviewed with pt, and pt verbalized understanding. Opportunity for questions provided.  

## 2018-12-12 ENCOUNTER — Telehealth: Payer: Self-pay | Admitting: Cardiology

## 2018-12-12 ENCOUNTER — Inpatient Hospital Stay (HOSPITAL_COMMUNITY): Admission: RE | Admit: 2018-12-12 | Payer: Self-pay | Source: Ambulatory Visit

## 2018-12-12 NOTE — Telephone Encounter (Signed)
  [facility] called to let us know that Theresa Burke did not show up for her Covid testing today as a prescreening to her GXT

## 2018-12-13 NOTE — Telephone Encounter (Signed)
PATIENT  SPOKE WTH SCHEDULE - POSTPONE  GXT

## 2018-12-13 NOTE — Telephone Encounter (Signed)
FYI - PATIENT CALLED AND CANCELLED GXT . COVID TEST NOT NEEDED

## 2018-12-13 NOTE — Telephone Encounter (Signed)
LMTCB

## 2018-12-15 ENCOUNTER — Ambulatory Visit (HOSPITAL_COMMUNITY)
Admission: EM | Admit: 2018-12-15 | Discharge: 2018-12-15 | Disposition: A | Discharge status: Home / Self Care | Payer: Self-pay | Attending: Emergency Medicine | Admitting: Emergency Medicine

## 2018-12-15 ENCOUNTER — Other Ambulatory Visit: Payer: Self-pay

## 2018-12-15 ENCOUNTER — Encounter (HOSPITAL_COMMUNITY): Payer: Self-pay | Admitting: Emergency Medicine

## 2018-12-15 DIAGNOSIS — R0789 Other chest pain: Secondary | ICD-10-CM

## 2018-12-15 DIAGNOSIS — K219 Gastro-esophageal reflux disease without esophagitis: Secondary | ICD-10-CM

## 2018-12-15 DIAGNOSIS — L089 Local infection of the skin and subcutaneous tissue, unspecified: Secondary | ICD-10-CM

## 2018-12-15 MED ORDER — MUPIROCIN 2 % EX OINT: 1.0000 "application " | TOPICAL_OINTMENT | Freq: Three times a day (TID) | CUTANEOUS | 0 refills | Status: DC

## 2018-12-15 MED ORDER — PANTOPRAZOLE SODIUM 20 MG PO TBEC: 20.0000 mg | DELAYED_RELEASE_TABLET | Freq: Every day | ORAL | 0 refills | Status: DC

## 2018-12-15 MED ORDER — IBUPROFEN 600 MG PO TABS: 600.0000 mg | ORAL_TABLET | Freq: Four times a day (QID) | ORAL | 0 refills | Status: DC | PRN

## 2018-12-15 NOTE — ED Provider Notes (Signed)
HPI  SUBJECTIVE:  Theresa Burke is a 38 y.o. female who presents with burning diffuse chest pain since April.  She reports decreased appetite due to this.  It has not changed today.  Is intermittent, lasting hours.  She states that it occasionally radiates to her neck and arm.  She reports diaphoresis at night only.  She has been seen in the ER 4 times for this since April and has had an extensive multiple negative work-ups.  She has also been seen by cardiology for this, and was a started on famotidine 20 mg twice daily and prednisone.  She states that she has not yet started the famotidine prescribed to her because she is scared to take it.  She states the prednisone made her symptoms worse and so she stopped it.  She has also tried Prilosec.  She states that the pain is better with sitting up, GI cocktail given to her in the ER one time.  It is worse lying down, with arm movement, torso rotation. patient states that she cannot lie down flat due to the pain.  She denies fevers, coughing, wheezing, shortness of breath, dyspnea on exertion, abdominal pain.  She reports water brash and belching.  No calf pain or swelling.  No exertional component.  She has not tried any H2 blockers or PPIs for this.  She has a past medical history of anxiety and provisional diagnosis of GERD.  No history of cancer, DVT, PE, diabetes, MI, hypertension, hypercholesterolemia, smoking.  Family history negative for early MI.  She cardiology: Dr. Herbie BaltimoreHarding.  She has a stress test scheduled for July 17.  LMP: Middle/end of May.  She had a negative pregnancy test 4 days ago.  Been sexually active since.  PMD: [facility] but states that they are closed until October.  She also notes a erythematous nontender nonpainful lesion posterior left arm that started after her most recent ER visit.  No aggravating or alleviating factors.  It has not changed since it started.  She has not tried anything for this.  4/16- ER for  shortness of breath, chest pain, thought to be due to anxiety.  D-dimer EKG, labs, chest x-ray were normal. 5/28- ED for chest pain, work-up including chest x-ray and 2 troponins were negative.  Noted to have reproducible chest  Pain on the sternum. 6/4- ER visit for burning chest pain, work-up including 2 troponins were negative.  Was thought to have heartburn, prescribed Pepcid. Seen by cardiology later on 6/4 for this, thought to have chest wall pain, was sent home with 12-day prednisone taper and famotidine 20 mg twice daily for 6 weeks.  Exercise tolerance test ordered. 6/13-ER visit for burning epigastric pain.  Chest x-ray, EKG, CMP, troponin all normal. pregnancy test negative.  Right upper quadrant ultrasound normal.  Symptoms improved with GI cocktail, Pepcid.   Thought to have acid reflux.    Past Medical History:  Diagnosis Date  . Anxiety     Past Surgical History:  Procedure Laterality Date  . NO PAST SURGERIES      Family History  Problem Relation Age of Onset  . Cancer Mother   . Other Other        doesn't really know - but no h/o CAD, HLD HTN or DM.     Social History   Tobacco Use  . Smoking status: Former Games developermoker  . Smokeless tobacco: Never Used  Substance Use Topics  . Alcohol use: Yes  Comment: social  . Drug use: Never    No current facility-administered medications for this encounter.   Current Outpatient Medications:  .  busPIRone (BUSPAR) 7.5 MG tablet, Take 1 tablet (7.5 mg total) by mouth 2 (two) times daily. May increase to 3 x a day.  May double dose., Disp: 30 tablet, Rfl: 0 .  famotidine (PEPCID) 20 MG tablet, Take 1 tablet (20 mg total) by mouth 2 (two) times daily. FOR 6 WEEKS THEN STOP, Disp: 84 tablet, Rfl: 0 .  ibuprofen (ADVIL) 600 MG tablet, Take 1 tablet (600 mg total) by mouth every 6 (six) hours as needed., Disp: 30 tablet, Rfl: 0 .  mupirocin ointment (BACTROBAN) 2 %, Apply 1 application topically 3 (three) times daily., Disp: 22 g,  Rfl: 0 .  pantoprazole (PROTONIX) 20 MG tablet, Take 1 tablet (20 mg total) by mouth daily., Disp: 30 tablet, Rfl: 0  No Known Allergies   ROS  As noted in HPI.   Physical Exam  BP 111/78   Pulse 92   Temp 98.5 F (36.9 C) (Tympanic)   LMP 11/25/2018   SpO2 98%   Constitutional: Well developed, well nourished, appears anxious.  Slightly tearful. Eyes:  EOMI, conjunctiva normal bilaterally HENT: Normocephalic, atraumatic,mucus membranes moist Respiratory: Normal inspiratory effort, lungs clear bilaterally. Cardiovascular: Normal rate, regular rhythm, no murmurs rubs or gallops.  Positive tenderness along the sternum.  No other chest wall tenderness. GI: nondistended soft, nontender, active bowel sounds, no rebound or guarding. skin: 1 x 0.5 cm nontender erythematous erosion posterior left arm. no crusting.      Musculoskeletal: no deformities, calves symmetric, nontender.  Trace edema bilaterally. Neurologic: Alert & oriented x 3, no focal neuro deficits Psychiatric: Speech and behavior appropriate   ED Course   Medications - No data to display  Orders Placed This Encounter  Procedures  . ED EKG    Standing Status:   Standing    Number of Occurrences:   1    Order Specific Question:   Reason for Exam    Answer:   Chest Pain  . EKG 12-Lead    Standing Status:   Standing    Number of Occurrences:   1    No results found for this or any previous visit (from the past 24 hour(s)). No results found.  ED Clinical Impression  Chest wall pain -   Gastroesophageal reflux disease, esophagitis presence not specified -   Skin infection -    ED Assessment/Plan  ER records, labs extensively reviewed.  As noted in HPI.  With the multiple negative ER work-ups, reproducible chest pain, this certainly sounds like GERD with some musculoskeletal chest pain.  Doubt ACS, PE, dissection, pneumothorax.  Will check an EKG today however as patient is symptomatic.  She has not  yet started the famotidine 20 mg twice daily prescribed to her by cardiology, will have her start this and start her on some Protonix as well.  She may also try ibuprofen 600 mg for the chest wall pain.   EKG: Normal sinus rhythm, rate 83.  Normal axis, normal intervals.  No hypertrophy.  No ST T wave changes.  Patient symptomatic when EKG was obtained  She also has a skin infection that she likely picked up in the ER.  Concern for MRSA.  Home with Bactroban.  Will provide a primary care referral list, as she states that [facility] where she was supposed to establish care is currently closed till October.  She  is to follow-up with her cardiologist.  Advised her to get her stress test as scheduled on July 17.  Discussed MDM, treatment plan, and plan for follow-up with patient. Discussed sn/sx that should prompt return to the ED. patient agrees with plan.   Meds ordered this encounter  Medications  . pantoprazole (PROTONIX) 20 MG tablet    Sig: Take 1 tablet (20 mg total) by mouth daily.    Dispense:  30 tablet    Refill:  0  . mupirocin ointment (BACTROBAN) 2 %    Sig: Apply 1 application topically 3 (three) times daily.    Dispense:  22 g    Refill:  0  . ibuprofen (ADVIL) 600 MG tablet    Sig: Take 1 tablet (600 mg total) by mouth every 6 (six) hours as needed.    Dispense:  30 tablet    Refill:  0    *This clinic note was created using Scientist, clinical (histocompatibility and immunogenetics)Dragon dictation software. Therefore, there may be occasional mistakes despite careful proofreading.   ?   Domenick GongMortenson, Regino Fournet, MD 12/15/18 1658

## 2018-12-15 NOTE — Discharge Instructions (Addendum)
Start taking the famotidine, try the Protonix if the famotidine has not helped and 3 days.  You can try the ibuprofen, but this may aggravate your acid reflux.  It will however help with the chest pain.  The Bactroban is for the lesion on your left arm.  Follow-up with the primary care physician of your choice, see list below.  Below is a list of primary care practices who are taking new patients for you to follow-up with.  [facility] at [facility] [address] [place], [place] [address] [phone]  [place], [place] [address] [phone]  [facility] [phone] [place] [address]  [facility]: [place]  [phone]  [facility]: [place]   [phone]  [facility]: [address] [place]  [phone]  [place] primary [facility] : [address] [place] [phone]  [facility]: [address] [place] [address] [phone]  [facility]: [place] [phone]  Dr. Blanchie Serve [place]  [phone]  Dr. Benito Mccreedy, [facility]. [place], [place] [address]  [phone]  Go to www.goodrx.com to look up your medications. This will give you a list of where you can find your prescriptions at the most affordable prices. Or ask the pharmacist what the cash price is, or if they have any other discount programs available to help make your medication more affordable. This can be less expensive than what you would pay  with insurance.

## 2018-12-15 NOTE — ED Triage Notes (Signed)
PT has had several ED visits in the past 3 weeks for chest discomfort. This episode is no different per PT. Pain is worse at night and after eating. Has been diagnosed with GERD via ED physician.   Saw cardiology 2 weeks ago per PT.   Burning sensation in right arm, chest, and back.  Last visit to ED was Sunday and she had a full workup and was dishcharged home.

## 2018-12-19 ENCOUNTER — Emergency Department (HOSPITAL_COMMUNITY)
Admission: EM | Admit: 2018-12-19 | Discharge: 2018-12-19 | Disposition: A | Discharge status: Home / Self Care | Payer: Self-pay | Attending: Emergency Medicine | Admitting: Emergency Medicine

## 2018-12-19 ENCOUNTER — Encounter (HOSPITAL_COMMUNITY): Payer: Self-pay | Admitting: Emergency Medicine

## 2018-12-19 DIAGNOSIS — Z79899 Other long term (current) drug therapy: Secondary | ICD-10-CM | POA: Insufficient documentation

## 2018-12-19 DIAGNOSIS — K219 Gastro-esophageal reflux disease without esophagitis: Secondary | ICD-10-CM | POA: Insufficient documentation

## 2018-12-19 DIAGNOSIS — Z87891 Personal history of nicotine dependence: Secondary | ICD-10-CM | POA: Insufficient documentation

## 2018-12-19 DIAGNOSIS — I10 Essential (primary) hypertension: Secondary | ICD-10-CM | POA: Insufficient documentation

## 2018-12-19 MED ORDER — LIDOCAINE VISCOUS HCL 2 % MT SOLN: 15.0000 mL | Freq: Once | OROMUCOSAL | Status: DC

## 2018-12-19 MED ORDER — ALUM & MAG HYDROXIDE-SIMETH 200-200-20 MG/5ML PO SUSP: 30.0000 mL | Freq: Once | ORAL | Status: DC

## 2018-12-19 NOTE — Discharge Instructions (Addendum)
Take Maalox as directed as needed for symptom relief. Follow up with GI, call to schedule an appointment.

## 2018-12-19 NOTE — ED Provider Notes (Signed)
[facility] EMERGENCY DEPARTMENT Provider Note   CSN: [id number] Arrival date & time: 12/19/18  0604    History   Chief Complaint Chief Complaint  Patient presents with  . Epigastric Pain    HPI Theresa Burke is a 38 y.o. female.     38 year old female brought in by EMS for burning in her chest.  Patient states that she has acid reflux, this is an ongoing problem for at least the past month.  Pain started about 11:00 last night when she went to lay down to go to sleep.  Patient is taking Pepcid for reflux, tried drinking baking soda mixed with water without any relief.  Symptoms improve when patient sits up and worsen with lying down.  Reports reflux of undigested food.  Patient denies any shortness of breath, leg swelling.  States this pain is similar to her previous episodes in which she has been seen in urgent care in the emergency room and worked up extensively for.  Patient has been seen by cardiology, scheduled for a stress test, has not been seen by GI.  No other complaints or concerns.     Past Medical History:  Diagnosis Date  . Anxiety   . Hypertension     Patient Active Problem List   Diagnosis Date Noted  . Chest wall pain 12/01/2018    Past Surgical History:  Procedure Laterality Date  . NO PAST SURGERIES       OB History   No obstetric history on file.      Home Medications    Prior to Admission medications   Medication Sig Start Date End Date Taking? Authorizing Provider  busPIRone (BUSPAR) 7.5 MG tablet Take 1 tablet (7.5 mg total) by mouth 2 (two) times daily. May increase to 3 x a day.  May double dose. 11/25/18   Raylene Everts, MD  famotidine (PEPCID) 20 MG tablet Take 1 tablet (20 mg total) by mouth 2 (two) times daily. FOR 6 WEEKS THEN STOP 12/01/18   Leonie Man, MD  ibuprofen (ADVIL) 600 MG tablet Take 1 tablet (600 mg total) by mouth every 6 (six) hours as needed. 12/15/18   Melynda Ripple, MD  mupirocin ointment  (BACTROBAN) 2 % Apply 1 application topically 3 (three) times daily. 12/15/18   Melynda Ripple, MD  pantoprazole (PROTONIX) 20 MG tablet Take 1 tablet (20 mg total) by mouth daily. 12/15/18   Melynda Ripple, MD    Family History Family History  Problem Relation Age of Onset  . Cancer Mother   . Other Other        doesn't really know - but no h/o CAD, HLD HTN or DM.     Social History Social History   Tobacco Use  . Smoking status: Former Research scientist (life sciences)  . Smokeless tobacco: Never Used  Substance Use Topics  . Alcohol use: Yes    Comment: social  . Drug use: Never     Allergies   Patient has no known allergies.   Review of Systems Review of Systems  Constitutional: Negative for fever.  Respiratory: Negative for shortness of breath.   Cardiovascular: Negative for chest pain.  Gastrointestinal: Positive for abdominal pain. Negative for blood in stool, constipation, diarrhea, nausea and vomiting.  Musculoskeletal: Negative for arthralgias and myalgias.  Skin: Negative for rash and wound.  Allergic/Immunologic: Negative for immunocompromised state.  Neurological: Negative for dizziness and weakness.  All other systems reviewed and are negative.    Physical Exam Updated  Vital Signs BP 99/78   Pulse 95   Temp 97.9 F (36.6 C) (Axillary) Comment: pt refused to take off her mask for an oral temp  Resp 17   LMP 11/25/2018   SpO2 99%   Physical Exam Vitals signs and nursing note reviewed.  Constitutional:      General: She is not in acute distress.    Appearance: She is well-developed. She is not diaphoretic.  HENT:     Head: Normocephalic and atraumatic.     Mouth/Throat:     Mouth: Mucous membranes are moist.  Neck:     Musculoskeletal: Neck supple.  Cardiovascular:     Rate and Rhythm: Normal rate and regular rhythm.     Pulses: Normal pulses.     Heart sounds: Normal heart sounds.  Pulmonary:     Effort: Pulmonary effort is normal.     Breath sounds: Normal  breath sounds.  Abdominal:     Tenderness: There is abdominal tenderness in the epigastric area.     Comments: Mild epigastric tenderness.  Musculoskeletal:     Right lower leg: No edema.     Left lower leg: No edema.  Skin:    General: Skin is warm and dry.  Neurological:     Mental Status: She is alert and oriented to person, place, and time.  Psychiatric:        Behavior: Behavior normal.      ED Treatments / Results  Labs (all labs ordered are listed, but only abnormal results are displayed) Labs Reviewed - No data to display  EKG None  Radiology No results found.  Procedures Procedures (including critical care time)  Medications Ordered in ED Medications  alum & mag hydroxide-simeth (MAALOX/MYLANTA) 200-200-20 MG/5ML suspension 30 mL (has no administration in time range)    And  lidocaine (XYLOCAINE) 2 % viscous mouth solution 15 mL (has no administration in time range)     Initial Impression / Assessment and Plan / ED Course  I have reviewed the triage vital signs and the nursing notes.  Pertinent labs & imaging results that were available during my care of the patient were reviewed by me and considered in my medical decision making (see chart for details).  Clinical Course as of Dec 18 836  Mon Dec 19, 2018  [id number] 38 year old female presents with complaint of burning in her chest with epigastric discomfort onset 11:00 last night when she laid down to go to sleep.  Patient has nightly pain like this for at least the past month, seen in the ER with cardiac work-up as well as right upper quadrant ultrasound without any findings.  Patient is taking Pepcid for her symptoms without relief.  Exam patient has very mild epigastric tenderness.  Patient will be given GI cocktail in the ER, advised to try Maalox at night as well as dietary changes and elevating the head of her bed.  Patient will be referred to GI for follow-up.   [LM]    Clinical Course User Index [LM]  Jeannie FendMurphy,  A, PA-C      Final Clinical Impressions(s) / ED Diagnoses   Final diagnoses:  Gastroesophageal reflux disease, esophagitis presence not specified    ED Discharge Orders    None       Jeannie FendMurphy,  A, PA-C 12/19/18 [id number]    Arby BarrettePfeiffer, Marcy, MD 12/19/18 1120

## 2018-12-19 NOTE — ED Notes (Signed)
Patient verbalizes understanding of discharge instructions. Opportunity for questioning and answers were provided. Armband removed by staff, pt discharged from ED home via POV.  

## 2018-12-19 NOTE — ED Triage Notes (Signed)
BIB EMS from home. Pt reports epigastric pain that woke her up from sleep, burning sensation in the middle of her chest. Pt does have hx of GERD. Ambulatory. VSS.

## 2019-01-10 ENCOUNTER — Telehealth: Payer: Self-pay | Admitting: *Deleted

## 2019-01-10 NOTE — Telephone Encounter (Signed)
LEFT MESSAGE TO CALL BACK - NEED TO DISCUSS IF  01/13/19 APPT IS NECESSARY - PATIENT DID NOT HAVEGXTt . She  Called an postpone test - states she would call back to reschedule .

## 2019-01-12 NOTE — Telephone Encounter (Signed)
LVM, reminding pt of her appt with Dr Ellyn Hack on 01-13-19. I also left a message for pt to call and give her consent for her Virtual Visit.

## 2019-01-13 ENCOUNTER — Telehealth: Payer: Self-pay | Admitting: Cardiology

## 2019-01-31 ENCOUNTER — Telehealth (HOSPITAL_COMMUNITY): Payer: Self-pay

## 2019-01-31 NOTE — Telephone Encounter (Signed)
FYI

## 2019-01-31 NOTE — Telephone Encounter (Signed)
OK - we will see if she comes back.  Glenetta Hew, MD

## 2019-01-31 NOTE — Telephone Encounter (Signed)
FYI --ALSO HAVE ATTEMPTED TO CONTACT PATIENT WITH OUT SUCCESS  THANKS

## 2019-01-31 NOTE — Telephone Encounter (Signed)
New message   Just an FYI. We have made several attempts to contact this patient including sending a letter to schedule or reschedule their EXERCISE TOLERANCE TEST. We will be removing the patient from the WQ.    7.30.20 @ 11:08am both @ are the same - lm on home vm Theresa Burke  7.22.20 @ 10:36am lm on home vm Theresa Burke  7.7.20 @ 9:38am lm on home vm - Theresa Burke

## 2019-03-01 ENCOUNTER — Emergency Department (HOSPITAL_COMMUNITY)
Admission: EM | Admit: 2019-03-01 | Discharge: 2019-03-01 | Disposition: A | Discharge status: Home / Self Care | Payer: Self-pay | Attending: Emergency Medicine | Admitting: Emergency Medicine

## 2019-03-01 DIAGNOSIS — K429 Umbilical hernia without obstruction or gangrene: Secondary | ICD-10-CM | POA: Insufficient documentation

## 2019-03-01 DIAGNOSIS — Z79899 Other long term (current) drug therapy: Secondary | ICD-10-CM | POA: Insufficient documentation

## 2019-03-01 DIAGNOSIS — I10 Essential (primary) hypertension: Secondary | ICD-10-CM | POA: Insufficient documentation

## 2019-03-01 DIAGNOSIS — Z87891 Personal history of nicotine dependence: Secondary | ICD-10-CM | POA: Insufficient documentation

## 2019-03-01 LAB — COMPREHENSIVE METABOLIC PANEL
ALT: 12 U/L (ref 0–44)
AST: 12 U/L — ABNORMAL LOW (ref 15–41)
Albumin: 3.3 g/dL — ABNORMAL LOW (ref 3.5–5.0)
Alkaline Phosphatase: 102 U/L (ref 38–126)
Anion gap: 11 (ref 5–15)
BUN: 15 mg/dL (ref 6–20)
CO2: 22 mmol/L (ref 22–32)
Calcium: 9 mg/dL (ref 8.9–10.3)
Chloride: 104 mmol/L (ref 98–111)
Creatinine, Ser: 0.75 mg/dL (ref 0.44–1.00)
GFR calc Af Amer: >60 mL/min (ref >60)
GFR calc non Af Amer: >60 mL/min (ref >60)
Glucose, Bld: 90 mg/dL (ref 70–99)
Potassium: 4 mmol/L (ref 3.5–5.1)
Sodium: 137 mmol/L (ref 135–145)
Total Bilirubin: 0.5 mg/dL (ref 0.3–1.2)
Total Protein: 6.8 g/dL (ref 6.5–8.1)

## 2019-03-01 LAB — CBC
HCT: 38.2 % (ref 36.0–46.0)
Hemoglobin: 11.6 g/dL — ABNORMAL LOW (ref 12.0–15.0)
MCH: 29.9 pg (ref 26.0–34.0)
MCHC: 30.4 g/dL (ref 30.0–36.0)
MCV: 98.5 fL (ref 80.0–100.0)
Platelets: 278 10*3/uL (ref 150–400)
RBC: 3.88 MIL/uL (ref 3.87–5.11)
RDW: 13.5 % (ref 11.5–15.5)
WBC: 3.9 10*3/uL — ABNORMAL LOW (ref 4.0–10.5)
nRBC: 0 % (ref 0.0–0.2)

## 2019-03-01 LAB — URINALYSIS, ROUTINE W REFLEX MICROSCOPIC
Bilirubin Urine: NEGATIVE
Glucose, UA: NEGATIVE mg/dL
Hgb urine dipstick: NEGATIVE
Ketones, ur: NEGATIVE mg/dL
Nitrite: NEGATIVE
Protein, ur: NEGATIVE mg/dL
Specific Gravity, Urine: 1.023 (ref 1.005–1.030)
pH: 5 (ref 5.0–8.0)

## 2019-03-01 LAB — LIPASE, BLOOD: Lipase: 26 U/L (ref 11–51)

## 2019-03-01 LAB — I-STAT BETA HCG BLOOD, ED (MC, WL, AP ONLY): I-stat hCG, quantitative: <5 m[IU]/mL (ref <5)

## 2019-03-01 MED ORDER — SODIUM CHLORIDE 0.9% FLUSH: 3.0000 mL | Freq: Once | INTRAVENOUS | Status: DC

## 2019-03-01 MED ORDER — HYDROMORPHONE HCL 1 MG/ML IJ SOLN
1.0000 mg | Freq: Once | INTRAMUSCULAR | Status: AC
  Administered 2019-03-01: 1 mg via INTRAVENOUS

## 2019-03-01 MED ORDER — HYDROMORPHONE HCL 1 MG/ML IJ SOLN
0.5000 mg | Freq: Once | INTRAMUSCULAR | Status: DC
  Filled 2019-03-01: qty 1

## 2019-03-01 NOTE — ED Notes (Addendum)
RN and PA at bedside to reduce umbilical hernia. Pt tolerated well

## 2019-03-01 NOTE — ED Triage Notes (Signed)
Pt state 3 days ago she noticed a small bulge at her umbilicus and has been having pain around this area for 3 days. Denies any n/v/d. Pt was told to come to ER.

## 2019-03-01 NOTE — Discharge Instructions (Addendum)
Please read attached information. If you experience any new or worsening signs or symptoms please return to the emergency room for evaluation. Please follow-up with your primary care provider or specialist as discussed.  °

## 2019-03-01 NOTE — ED Provider Notes (Signed)
[facility] EMERGENCY [facility]PARTMENT Provider Note   CSN: [id number] Arrival date & time: 03/01/19  1047     History   Chief Complaint Chief Complaint  Patient presents with  . Hernia    HPI Theresa Burke is a 38 y.o. female.     HPI   38 year old female presents today with complaints of abdominal pain.  Patient notes that over the last year she has had pain in her periumbilical abdominal region with bulging.  She notes this usually comes in and out.  She notes that over the last 3 days the bulge has not come back and after doing heavy lifting.  She notes pain at the area.  She denies any fever, nausea, vomiting, constipation.  She notes normal bowel movements.  She did not drink or eat anything today.  No history of abdominal surgeries.   Past Medical History:  Diagnosis Date  . Anxiety   . Hypertension     Patient Active Problem List   Diagnosis Date Noted  . Chest wall pain 12/01/2018    Past Surgical History:  Procedure Laterality Date  . NO PAST SURGERIES       OB History   No obstetric history on file.     Home Medications    Prior to Admission medications   Medication Sig Start Date End Date Taking? Authorizing Provider  busPIRone (BUSPAR) 7.5 MG tablet Take 1 tablet (7.5 mg total) by mouth 2 (two) times daily. May increase to 3 x a day.  May double dose. 11/25/18   Eustace MooreNelson, Yvonne Sue, MD  famotidine (PEPCID) 20 MG tablet Take 1 tablet (20 mg total) by mouth 2 (two) times daily. FOR 6 WEEKS THEN STOP 12/01/18   Marykay LexHarding, David W, MD  ibuprofen (ADVIL) 600 MG tablet Take 1 tablet (600 mg total) by mouth every 6 (six) hours as needed. 12/15/18   Domenick GongMortenson, Ashley, MD  mupirocin ointment (BACTROBAN) 2 % Apply 1 application topically 3 (three) times daily. 12/15/18   Domenick GongMortenson, Ashley, MD  pantoprazole (PROTONIX) 20 MG tablet Take 1 tablet (20 mg total) by mouth daily. 12/15/18   Domenick GongMortenson, Ashley, MD    Family History Family History  Problem  Relation Age of Onset  . Cancer Mother   . Other Other        doesn't really know - but no h/o CAD, HLD HTN or DM.     Social History Social History   Tobacco Use  . Smoking status: Former Games developermoker  . Smokeless tobacco: Never Used  Substance Use Topics  . Alcohol use: Yes    Comment: social  . Drug use: Never     Allergies   Patient has no known allergies.   Review of Systems Review of Systems  All other systems reviewed and are negative.   Physical Exam Updated Vital Signs BP 96/85 (BP Location: Right Arm)   Pulse 65   Temp 98.4 F (36.9 C) (Oral)   Resp 18   SpO2 100%   Physical Exam Vitals signs and nursing note reviewed.  Constitutional:      Appearance: She is well-developed.  HENT:     Head: Normocephalic and atraumatic.  Eyes:     General: No scleral icterus.       Right eye: No discharge.        Left eye: No discharge.     Conjunctiva/sclera: Conjunctivae normal.     Pupils: Pupils are equal, round, and reactive to light.  Neck:  Musculoskeletal: Normal range of motion.     Vascular: No JVD.     Trachea: No tracheal deviation.  Pulmonary:     Effort: Pulmonary effort is normal.     Breath sounds: No stridor.  Abdominal:     Comments: Mass noted at the local region no overlying erythema- remainder of abdomen soft non tender   Neurological:     Mental Status: She is alert and oriented to person, place, and time.     Coordination: Coordination normal.  Psychiatric:        Behavior: Behavior normal.        Thought Content: Thought content normal.        Judgment: Judgment normal.     ED Treatments / Results  Labs (all labs ordered are listed, but only abnormal results are displayed) Labs Reviewed  COMPREHENSIVE METABOLIC PANEL - Abnormal; Notable for the following components:      Result Value   Albumin 3.3 (*)    AST 12 (*)    All other components within normal limits  CBC - Abnormal; Notable for the following components:   WBC 3.9  (*)    Hemoglobin 11.6 (*)    All other components within normal limits  URINALYSIS, ROUTINE W REFLEX MICROSCOPIC - Abnormal; Notable for the following components:   APPearance HAZY (*)    Leukocytes,Ua SMALL (*)    Bacteria, UA RARE (*)    All other components within normal limits  LIPASE, BLOOD  I-STAT BETA HCG BLOOD, ED ([facility], [facility], [facility] ONLY)    EKG None  Radiology No results found.  Procedures Procedures (including critical care time)  Medications Ordered in ED Medications  sodium chloride flush (NS) 0.9 % injection 3 mL (has no administration in time range)  HYDROmorphone (DILAUDID) injection 1 mg (1 mg Intravenous Given 03/01/19 1441)     Initial Impression / Assessment and Plan / ED Course  I have reviewed the triage vital signs and the nursing notes.  Pertinent labs & imaging results that were available during my care of the patient were reviewed by me and considered in my medical decision making (see chart for details).        38 year old female presents today with umbilical hernia.  She was given Dilaudid which allowed her to relax I was able to reduce the hernia without difficulty.  Patient stayed here in the ED and was monitored she had no recurrence.  I have very low suspicion for any obstruction or significant bowel damage.  She is afebrile with no white count and no significant abdominal tenderness post reduction.  Patient will follow-up as an outpatient with general surgery return immediately if she develops any new or worsening signs or symptoms.  She verbalized understanding and agreement to today's plan.  Final Clinical Impressions(s) / ED Diagnoses   Final diagnoses:  Umbilical hernia without obstruction and without gangrene    ED Discharge Orders    None       Okey Regal, PA-C 03/01/19 [place], [place], DO 03/01/19 2049

## 2019-03-01 NOTE — ED Notes (Signed)
Patient verbalizes understanding of discharge instructions . Opportunity for questions and answers were provided . Armband removed by staff ,Pt discharged from ED. W/C  offered at D/C  and Declined W/C at D/C and was escorted to lobby by RN.  

## 2019-03-06 ENCOUNTER — Emergency Department (HOSPITAL_COMMUNITY): Payer: Self-pay

## 2019-03-06 ENCOUNTER — Emergency Department (HOSPITAL_COMMUNITY)
Admission: EM | Admit: 2019-03-06 | Discharge: 2019-03-06 | Disposition: A | Discharge status: Home / Self Care | Payer: Self-pay | Attending: Emergency Medicine | Admitting: Emergency Medicine

## 2019-03-06 ENCOUNTER — Other Ambulatory Visit: Payer: Self-pay

## 2019-03-06 DIAGNOSIS — I10 Essential (primary) hypertension: Secondary | ICD-10-CM | POA: Insufficient documentation

## 2019-03-06 DIAGNOSIS — K429 Umbilical hernia without obstruction or gangrene: Secondary | ICD-10-CM

## 2019-03-06 DIAGNOSIS — Z79899 Other long term (current) drug therapy: Secondary | ICD-10-CM | POA: Insufficient documentation

## 2019-03-06 DIAGNOSIS — Z87891 Personal history of nicotine dependence: Secondary | ICD-10-CM | POA: Insufficient documentation

## 2019-03-06 LAB — COMPREHENSIVE METABOLIC PANEL
ALT: 13 U/L (ref 0–44)
AST: 14 U/L — ABNORMAL LOW (ref 15–41)
Albumin: 3.5 g/dL (ref 3.5–5.0)
Alkaline Phosphatase: 115 U/L (ref 38–126)
Anion gap: 8 (ref 5–15)
BUN: 12 mg/dL (ref 6–20)
CO2: 26 mmol/L (ref 22–32)
Calcium: 9 mg/dL (ref 8.9–10.3)
Chloride: 105 mmol/L (ref 98–111)
Creatinine, Ser: 0.85 mg/dL (ref 0.44–1.00)
GFR calc Af Amer: >60 mL/min (ref >60)
GFR calc non Af Amer: >60 mL/min (ref >60)
Glucose, Bld: 111 mg/dL — ABNORMAL HIGH (ref 70–99)
Potassium: 3.7 mmol/L (ref 3.5–5.1)
Sodium: 139 mmol/L (ref 135–145)
Total Bilirubin: 0.2 mg/dL — ABNORMAL LOW (ref 0.3–1.2)
Total Protein: 7.1 g/dL (ref 6.5–8.1)

## 2019-03-06 LAB — CBC WITH DIFFERENTIAL/PLATELET
Abs Immature Granulocytes: 0.01 10*3/uL (ref 0.00–0.07)
Basophils Absolute: 0 10*3/uL (ref 0.0–0.1)
Basophils Relative: 1 %
Eosinophils Absolute: 0 10*3/uL (ref 0.0–0.5)
Eosinophils Relative: 1 %
HCT: 38.7 % (ref 36.0–46.0)
Hemoglobin: 11.9 g/dL — ABNORMAL LOW (ref 12.0–15.0)
Immature Granulocytes: 0 %
Lymphocytes Relative: 27 %
Lymphs Abs: 1.3 10*3/uL (ref 0.7–4.0)
MCH: 29.9 pg (ref 26.0–34.0)
MCHC: 30.7 g/dL (ref 30.0–36.0)
MCV: 97.2 fL (ref 80.0–100.0)
Monocytes Absolute: 0.3 10*3/uL (ref 0.1–1.0)
Monocytes Relative: 6 %
Neutro Abs: 3 10*3/uL (ref 1.7–7.7)
Neutrophils Relative %: 65 %
Platelets: 292 10*3/uL (ref 150–400)
RBC: 3.98 MIL/uL (ref 3.87–5.11)
RDW: 13.2 % (ref 11.5–15.5)
WBC: 4.6 10*3/uL (ref 4.0–10.5)
nRBC: 0 % (ref 0.0–0.2)

## 2019-03-06 LAB — URINALYSIS, ROUTINE W REFLEX MICROSCOPIC
Bilirubin Urine: NEGATIVE
Glucose, UA: NEGATIVE mg/dL
Ketones, ur: NEGATIVE mg/dL
Nitrite: NEGATIVE
Protein, ur: 100 mg/dL — AB
RBC / HPF: >50 RBC/hpf — ABNORMAL HIGH (ref 0–5)
Specific Gravity, Urine: 1.034 — ABNORMAL HIGH (ref 1.005–1.030)
pH: 5 (ref 5.0–8.0)

## 2019-03-06 LAB — I-STAT BETA HCG BLOOD, ED (MC, WL, AP ONLY): I-stat hCG, quantitative: <5 m[IU]/mL (ref <5)

## 2019-03-06 MED ORDER — DIPHENHYDRAMINE HCL 25 MG PO CAPS
25.0000 mg | ORAL_CAPSULE | Freq: Once | ORAL | Status: AC
  Administered 2019-03-06: 25 mg via ORAL
  Filled 2019-03-06: qty 1

## 2019-03-06 MED ORDER — HYDROMORPHONE HCL 1 MG/ML IJ SOLN
1.0000 mg | Freq: Once | INTRAMUSCULAR | Status: AC
  Administered 2019-03-06: 1 mg via INTRAVENOUS
  Filled 2019-03-06: qty 1

## 2019-03-06 MED ORDER — IOHEXOL 300 MG/ML  SOLN
100.0000 mL | Freq: Once | INTRAMUSCULAR | Status: AC | PRN
  Administered 2019-03-06: 100 mL via INTRAVENOUS

## 2019-03-06 NOTE — ED Provider Notes (Signed)
[facility]   CSN: [id number] Arrival date & time: 03/06/19  1658   History   Chief Complaint Chief Complaint  Patient presents with  . Hernia   HPI Theresa Burke is a 38 y.o. female with past medical history significant for anxiety, hypertension, umbilical hernia who presents for evaluation of periumbilical pain.  Patient states she was seen in emergency department 5 days ago for similar symptoms.  Was diagnosed with a periumbilical hernia which was reduced in the department.  Patient states she woke up this morning and leaned forward and felt that it "popped out."  Patient states she has felt significant pain to the area.  Patient states she also has lower abdominal cramping which she relates to her menstrual cycle which started yesterday.  Has headache, vision changes, nausea, vomiting, chest pain, shortness of breath, diarrhea, dysuria, concerns for STDs, vaginal discharge.  Has not taken anything for her pain.  She rates her current pain a 9/10. Passing gas and normal bowel movements. No nausea or vomiting. No prior hx of abd surgeries.  History obtained from patient and past medical records.  No interpreter is used.     HPI  Past Medical History:  Diagnosis Date  . Anxiety   . Hypertension     Patient Active Problem List   Diagnosis Date Noted  . Chest wall pain 12/01/2018    Past Surgical History:  Procedure Laterality Date  . NO PAST SURGERIES       OB History   No obstetric history on file.      Home Medications    Prior to Admission medications   Medication Sig Start Date End Date Taking? Authorizing Provider  busPIRone (BUSPAR) 7.5 MG tablet Take 1 tablet (7.5 mg total) by mouth 2 (two) times daily. May increase to 3 x a day.  May double dose. 11/25/18   Eustace MooreNelson, Yvonne Sue, MD  famotidine (PEPCID) 20 MG tablet Take 1 tablet (20 mg total) by mouth 2 (two) times daily. FOR 6 WEEKS THEN STOP 12/01/18   Marykay LexHarding,  David W, MD  ibuprofen (ADVIL) 600 MG tablet Take 1 tablet (600 mg total) by mouth every 6 (six) hours as needed. 12/15/18   Domenick GongMortenson, Ashley, MD  mupirocin ointment (BACTROBAN) 2 % Apply 1 application topically 3 (three) times daily. 12/15/18   Domenick GongMortenson, Ashley, MD  pantoprazole (PROTONIX) 20 MG tablet Take 1 tablet (20 mg total) by mouth daily. 12/15/18   Domenick GongMortenson, Ashley, MD    Family History Family History  Problem Relation Age of Onset  . Cancer Mother   . Other Other        doesn't really know - but no h/o CAD, HLD HTN or DM.     Social History Social History   Tobacco Use  . Smoking status: Former Games developermoker  . Smokeless tobacco: Never Used  Substance Use Topics  . Alcohol use: Yes    Comment: social  . Drug use: Never     Allergies   Patient has no known allergies.   Review of Systems Review of Systems  Constitutional: Negative.   HENT: Negative.   Cardiovascular: Negative.   Gastrointestinal: Positive for abdominal pain. Negative for abdominal distention, anal bleeding, constipation, diarrhea, nausea, rectal pain and vomiting.  Musculoskeletal: Negative.   Skin: Negative.   Neurological: Negative.   All other systems reviewed and are negative.    Physical Exam Updated Vital Signs BP (!) 109/52   Pulse 76  Temp 97.7 F (36.5 C) (Oral)   Resp 16   Ht 5\' 4"  (1.626 m)   Wt 90.7 kg   LMP 03/05/2019 (Exact Date)   SpO2 99%   BMI 34.33 kg/m   Physical Exam Vitals signs and nursing note reviewed.  Constitutional:      General: She is not in acute distress.    Appearance: She is well-developed. She is not ill-appearing, toxic-appearing or diaphoretic.  HENT:     Head: Normocephalic and atraumatic.     Nose: Nose normal.     Mouth/Throat:     Mouth: Mucous membranes are moist.     Pharynx: Oropharynx is clear.  Eyes:     Pupils: Pupils are equal, round, and reactive to light.  Neck:     Musculoskeletal: Normal range of motion.  Cardiovascular:      Rate and Rhythm: Normal rate.     Pulses: Normal pulses.     Heart sounds: Normal heart sounds.  Pulmonary:     Effort: Pulmonary effort is normal. No respiratory distress.     Breath sounds: Normal breath sounds.  Abdominal:     General: There is no distension.     Comments: Small superior periumbilical hernia.  No surrounding erythema, warmth, cellulitic changes.  No fluctuance or induration.  Mildly tender to palpation.  No rebound or guarding.  No lower abdominal tenderness palpation.  Musculoskeletal: Normal range of motion.     Comments: Moves all 4 extremities without difficulty.  Skin:    General: Skin is warm and dry.     Comments: No rashes or lesions.  Brisk capillary refill.  Neurological:     Mental Status: She is alert.    ED Treatments / Results  Labs (all labs ordered are listed, but only abnormal results are displayed) Labs Reviewed  CBC WITH DIFFERENTIAL/PLATELET - Abnormal; Notable for the following components:      Result Value   Hemoglobin 11.9 (*)    All other components within normal limits  COMPREHENSIVE METABOLIC PANEL - Abnormal; Notable for the following components:   Glucose, Bld 111 (*)    AST 14 (*)    Total Bilirubin 0.2 (*)    All other components within normal limits  URINALYSIS, ROUTINE W REFLEX MICROSCOPIC - Abnormal; Notable for the following components:   Color, Urine AMBER (*)    APPearance CLOUDY (*)    Specific Gravity, Urine 1.034 (*)    Hgb urine dipstick LARGE (*)    Protein, ur 100 (*)    Leukocytes,Ua TRACE (*)    RBC / HPF >50 (*)    Bacteria, UA RARE (*)    All other components within normal limits  URINE CULTURE  I-STAT BETA HCG BLOOD, ED ([facility], [facility], [facility] ONLY)    EKG None  Radiology Ct Abdomen Pelvis W Contrast  Result Date: 03/06/2019 CLINICAL DATA:  Abdominal pain. Umbilical hernia. EXAM: CT ABDOMEN AND PELVIS WITH CONTRAST TECHNIQUE: Multidetector CT imaging of the abdomen and pelvis was performed using the standard  protocol following bolus administration of intravenous contrast. CONTRAST:  100mL OMNIPAQUE IOHEXOL 300 MG/ML  SOLN COMPARISON:  None. FINDINGS: Lower chest: No acute abnormality. Hepatobiliary: No focal liver abnormality is seen. No gallstones, gallbladder wall thickening, or biliary dilatation. Pancreas: Unremarkable. No pancreatic ductal dilatation or surrounding inflammatory changes. Spleen: Normal in size without focal abnormality. Adrenals/Urinary Tract: Adrenal glands are unremarkable. Kidneys are normal, without renal calculi, focal lesion, or hydronephrosis. Bladder is unremarkable. Stomach/Bowel: Stomach is within  normal limits. Appendix appears normal. No evidence of bowel wall thickening, distention, or inflammatory changes. Vascular/Lymphatic: No significant vascular findings are present. No enlarged abdominal or pelvic lymph nodes. Reproductive: Uterus and bilateral adnexa are unremarkable. Other: Fat containing periumbilical anterior abdominal wall hernia with mild fat stranding within the hernial sac. Musculoskeletal: No acute or significant osseous findings. IMPRESSION: 1. Fat containing periumbilical anterior abdominal wall hernia with mild fat stranding within the hernial sac. 2. Otherwise no evidence of acute abnormalities within the abdomen or pelvis. Electronically Signed   By: Ted Mcalpine M.D.   On: 03/06/2019 19:22    Procedures Hernia reduction  Date/Time: 03/06/2019 7:48 PM Performed by: Linwood Dibbles, PA-C Authorized by: Linwood Dibbles, PA-C  Consent: Verbal consent obtained. Written consent not obtained. Risks and benefits: risks, benefits and alternatives were discussed Consent given by: patient Patient understanding: patient states understanding of the procedure being performed Patient consent: the patient's understanding of the procedure matches consent given Procedure consent: procedure consent matches procedure scheduled Relevant documents: relevant  documents present and verified Test results: test results available and properly labeled Site marked: the operative site was marked Imaging studies: imaging studies available Patient identity confirmed: verbally with patient and arm band Local anesthesia used: no  Anesthesia: Local anesthesia used: no  Sedation: Patient sedated: no  Patient tolerance: patient tolerated the procedure well with no immediate complications    (including critical care time)  Medications Ordered in ED Medications  HYDROmorphone (DILAUDID) injection 1 mg (1 mg Intravenous Given 03/06/19 1735)  iohexol (OMNIPAQUE) 300 MG/ML solution 100 mL (100 mLs Intravenous Contrast Given 03/06/19 1853)  diphenhydrAMINE (BENADRYL) capsule 25 mg (25 mg Oral Given 03/06/19 1943)   Initial Impression / Assessment and Plan / ED Course  I have reviewed the triage vital signs and the nursing notes.  Pertinent labs & imaging results that were available during my care of the patient were reviewed by me and considered in my medical decision making (see chart for details).  38 year old female appears otherwise well presents for evaluation of periumbilical pain.  Known periumbilical hernia.  Patient states this "popped out" this morning she has had pain to the area.  No overlying skin changes. She has not tried to reduce her hernia. Patient with small superior periumbilical hernia.  I was able to reduce this without difficulty.  Patient continues with pain after reduction.  Will provide pain management, labs and imaging.  She does have follow-up with general surgery in 2 days for reevaluation.  CBC without leukocytosis, hemoglobin 11.9 Metabolic panel with mildly elevated glucose at 111, no additional electrolyte, renal abnormality Pregnancy test negative Urinalysis with blood, trace leuks, rare bacteria however denies urinary symptoms.  She is on her cycle. Will culture urine. CT scan with umbilical anterior wall hernia containing fat  with mild stranding and hernial sac. Discussed CT findings with attending physician Dr. Judd Lien who states likely stranding from inflammation, no evidence of incarceration, strangulation.   1940: Reevaluation patient without any pain.  However her hernia did recur when she sat up from bed however is easily reducible with minimal effort. Low suspicion for strangulation or incarceration, bowel obstruction or bowel damage.  No abd tenderness after reduction. She did however get itching to her abdominal wall after IV contrast from CT scan.  She denies any skin changes, chest pain, shortness of breath, sensation of throat closing.  No evidence of respiratory distress. She was given Benadryl for her pruritis.  Observed without any additional  allergic reaction.  Tolerating p.o. intake in ED without difficulty.  Patient is nontoxic, nonseptic appearing, in no apparent distress.  Patient's pain and other symptoms adequately managed in emergency department. Labs, imaging and vitals reviewed.  Patient does not meet the SIRS or Sepsis criteria.  On repeat exam patient does not have a surgical abdomin and there are no peritoneal signs.  No indication of appendicitis, bowel obstruction, bowel perforation, cholecystitis, diverticulitis, serrated/strangulated hernia.  Patient to follow-up with [facility] in 2 days for which she has follow-up ointment for her periumbilical hernia.  Patient to return if hernia is not reducible.  The patient has been appropriately medically screened and/or stabilized in the ED. I have low suspicion for any other emergent medical condition which would require further screening, evaluation or treatment in the ED or require inpatient management.  Patient is hemodynamically stable and in no acute distress.  Patient able to ambulate in department prior to ED.  Evaluation does not show acute pathology that would require ongoing or additional emergent interventions while in the emergency  department or further inpatient treatment.  I have discussed the diagnosis with the patient and answered all questions.  Pain is been managed while in the emergency department and patient has no further complaints prior to discharge.  Patient is comfortable with plan discussed in room and is stable for discharge at this time.  I have discussed strict return precautions for returning to the emergency department.  Patient was encouraged to follow-up with PCP/specialist refer to at discharge.     Final Clinical Impressions(s) / ED Diagnoses   Final diagnoses:  Umbilical hernia without obstruction and without gangrene    ED Discharge Orders    None       Madora Barletta A, PA-C 03/06/19 2008    Veryl Speak, MD 03/06/19 2317

## 2019-03-06 NOTE — Discharge Instructions (Signed)
Take Tylenol and ibuprofen as needed for pain.  If your hernia recurs and is unable to be reduced by her self please seek reevaluation the emergency department.  Please keep your follow-up appointment with [facility] for evaluation.

## 2019-03-06 NOTE — ED Triage Notes (Signed)
Pt here for evaluation of umbilical hernia. Pt seen for same last week and has appt with a surgeon on 9/9 but it popped out today after a walk and came here to get it popped back in.

## 2019-03-06 NOTE — ED Notes (Signed)
Patient verbalizes understanding of discharge instructions. Opportunity for questioning and answers were provided. Armband removed by staff, pt discharged from ED ambulatory.   

## 2019-03-08 ENCOUNTER — Ambulatory Visit: Payer: Self-pay | Admitting: General Surgery

## 2019-03-08 LAB — URINE CULTURE

## 2019-03-21 ENCOUNTER — Other Ambulatory Visit: Payer: Self-pay

## 2019-03-21 ENCOUNTER — Encounter (HOSPITAL_COMMUNITY): Payer: Self-pay | Admitting: Emergency Medicine

## 2019-03-21 ENCOUNTER — Emergency Department (HOSPITAL_COMMUNITY)
Admission: EM | Admit: 2019-03-21 | Discharge: 2019-03-22 | Disposition: A | Discharge status: Home / Self Care | Payer: Self-pay | Attending: Emergency Medicine | Admitting: Emergency Medicine

## 2019-03-21 DIAGNOSIS — R112 Nausea with vomiting, unspecified: Secondary | ICD-10-CM | POA: Insufficient documentation

## 2019-03-21 DIAGNOSIS — Z87891 Personal history of nicotine dependence: Secondary | ICD-10-CM | POA: Insufficient documentation

## 2019-03-21 DIAGNOSIS — K429 Umbilical hernia without obstruction or gangrene: Secondary | ICD-10-CM | POA: Insufficient documentation

## 2019-03-21 DIAGNOSIS — Z79899 Other long term (current) drug therapy: Secondary | ICD-10-CM | POA: Insufficient documentation

## 2019-03-21 DIAGNOSIS — R197 Diarrhea, unspecified: Secondary | ICD-10-CM | POA: Insufficient documentation

## 2019-03-21 DIAGNOSIS — I10 Essential (primary) hypertension: Secondary | ICD-10-CM | POA: Insufficient documentation

## 2019-03-21 LAB — CBC
HCT: 37.7 % (ref 36.0–46.0)
Hemoglobin: 11.4 g/dL — ABNORMAL LOW (ref 12.0–15.0)
MCH: 29.5 pg (ref 26.0–34.0)
MCHC: 30.2 g/dL (ref 30.0–36.0)
MCV: 97.4 fL (ref 80.0–100.0)
Platelets: 267 10*3/uL (ref 150–400)
RBC: 3.87 MIL/uL (ref 3.87–5.11)
RDW: 13.7 % (ref 11.5–15.5)
WBC: 6.5 10*3/uL (ref 4.0–10.5)
nRBC: 0 % (ref 0.0–0.2)

## 2019-03-21 LAB — COMPREHENSIVE METABOLIC PANEL
ALT: 15 U/L (ref 0–44)
AST: 16 U/L (ref 15–41)
Albumin: 3.4 g/dL — ABNORMAL LOW (ref 3.5–5.0)
Alkaline Phosphatase: 96 U/L (ref 38–126)
Anion gap: 10 (ref 5–15)
BUN: 16 mg/dL (ref 6–20)
CO2: 20 mmol/L — ABNORMAL LOW (ref 22–32)
Calcium: 8.9 mg/dL (ref 8.9–10.3)
Chloride: 105 mmol/L (ref 98–111)
Creatinine, Ser: 0.71 mg/dL (ref 0.44–1.00)
GFR calc Af Amer: >60 mL/min (ref >60)
GFR calc non Af Amer: >60 mL/min (ref >60)
Glucose, Bld: 130 mg/dL — ABNORMAL HIGH (ref 70–99)
Potassium: 3.5 mmol/L (ref 3.5–5.1)
Sodium: 135 mmol/L (ref 135–145)
Total Bilirubin: 0.6 mg/dL (ref 0.3–1.2)
Total Protein: 7.2 g/dL (ref 6.5–8.1)

## 2019-03-21 LAB — LIPASE, BLOOD: Lipase: 35 U/L (ref 11–51)

## 2019-03-21 LAB — I-STAT BETA HCG BLOOD, ED (MC, WL, AP ONLY): I-stat hCG, quantitative: <5 m[IU]/mL (ref <5)

## 2019-03-21 MED ORDER — SODIUM CHLORIDE 0.9 % IV BOLUS (SEPSIS)
1000.0000 mL | Freq: Once | INTRAVENOUS | Status: AC
  Administered 2019-03-22: 1000 mL via INTRAVENOUS

## 2019-03-21 MED ORDER — SODIUM CHLORIDE 0.9% FLUSH: 3.0000 mL | Freq: Once | INTRAVENOUS | Status: DC

## 2019-03-21 MED ORDER — ONDANSETRON HCL 4 MG/2ML IJ SOLN
4.0000 mg | Freq: Once | INTRAMUSCULAR | Status: AC
  Administered 2019-03-21: 4 mg via INTRAVENOUS
  Filled 2019-03-21: qty 2

## 2019-03-21 MED ORDER — MORPHINE SULFATE (PF) 4 MG/ML IV SOLN
4.0000 mg | Freq: Once | INTRAVENOUS | Status: AC
  Administered 2019-03-21: 4 mg via INTRAVENOUS
  Filled 2019-03-21: qty 1

## 2019-03-21 NOTE — ED Triage Notes (Addendum)
Pt reports hx of umbilical hernia, states that she has been having a lot of lower abd pain in the area of her hernia that began today. Also reports taking some ibuprofen and vomiting after. Denies any black or bloody stools or vomit. Pt tearful in triage.

## 2019-03-21 NOTE — ED Provider Notes (Signed)
TIME SEEN: 11:28 PM  CHIEF COMPLAINT: Abdominal pain  HPI: Patient is a 38 year old female with history of hypertension, anxiety, umbilical hernia who presents emergency department with periumbilical pain, vomiting and diarrhea that started tonight.  No fevers, chills.  No dysuria, hematuria, vaginal bleeding or discharge.  Last menstrual period 03/05/2019.  No previous abdominal surgeries.  Diarrhea has resolved but still having pain and nausea.  Also states that "I want you to check my throat".  She states that she has been having difficulty swallowing times after eating for "a minute".  No sore throat.  No changes in speech.  No difficulty breathing.  No cough.  States she has been told this is related to acid reflux.  She does not have a gastroenterologist.  ROS: See HPI Constitutional: no fever  Eyes: no drainage  ENT: no runny nose   Cardiovascular:  no chest pain  Resp: no SOB  GI: Vomiting and diarrhea GU: no dysuria Integumentary: no rash  Allergy: no hives  Musculoskeletal: no leg swelling  Neurological: no slurred speech ROS otherwise negative  PAST MEDICAL HISTORY/PAST SURGICAL HISTORY:  Past Medical History:  Diagnosis Date  . Anxiety   . Hypertension     MEDICATIONS:  Prior to Admission medications   Medication Sig Start Date End Date Taking? Authorizing Provider  busPIRone (BUSPAR) 7.5 MG tablet Take 1 tablet (7.5 mg total) by mouth 2 (two) times daily. May increase to 3 x a day.  May double dose. 11/25/18   Eustace Moore, MD  famotidine (PEPCID) 20 MG tablet Take 1 tablet (20 mg total) by mouth 2 (two) times daily. FOR 6 WEEKS THEN STOP 12/01/18   Marykay Lex, MD  ibuprofen (ADVIL) 600 MG tablet Take 1 tablet (600 mg total) by mouth every 6 (six) hours as needed. 12/15/18   Domenick Gong, MD  mupirocin ointment (BACTROBAN) 2 % Apply 1 application topically 3 (three) times daily. 12/15/18   Domenick Gong, MD  pantoprazole (PROTONIX) 20 MG tablet Take 1  tablet (20 mg total) by mouth daily. 12/15/18   Domenick Gong, MD    ALLERGIES:  No Known Allergies  SOCIAL HISTORY:  Social History   Tobacco Use  . Smoking status: Former Games developer  . Smokeless tobacco: Never Used  Substance Use Topics  . Alcohol use: Yes    Comment: social    FAMILY HISTORY: Family History  Problem Relation Age of Onset  . Cancer Mother   . Other Other        doesn't really know - but no h/o CAD, HLD HTN or DM.     EXAM: BP 116/72 (BP Location: Left Arm)   Pulse 70   Temp 98.2 F (36.8 C) (Oral)   Resp 18   LMP 03/05/2019 (Exact Date)   SpO2 96%  CONSTITUTIONAL: Alert and oriented and responds appropriately to questions. Well-appearing; well-nourished HEAD: Normocephalic EYES: Conjunctivae clear, pupils appear equal, EOMI ENT: normal nose; moist mucous membranes; No pharyngeal erythema or petechiae, no tonsillar hypertrophy or exudate, no uvular deviation, no unilateral swelling, no trismus or drooling, no muffled voice, normal phonation, no stridor, no dental caries present, no drainable dental abscess noted, no Ludwig's angina, tongue sits flat in the bottom of the mouth, no angioedema, no facial erythema or warmth, no facial swelling; no pain with movement of the neck. NECK: Supple, no meningismus, no nuchal rigidity, no LAD  CARD: RRR; S1 and S2 appreciated; no murmurs, no clicks, no rubs, no gallops RESP: Normal  chest excursion without splinting or tachypnea; breath sounds clear and equal bilaterally; no wheezes, no rhonchi, no rales, no hypoxia or respiratory distress, speaking full sentences ABD/GI: Normal bowel sounds; non-distended; soft, tender around the umbilicus.  I do not appreciate a hernia on exam but exam limited given obesity.  There is no skin changes over the umbilicus.  No redness, warmth, ecchymosis or necrosis.  Mildly tender in the right lower quadrant as well.  No guarding or rebound on exam. BACK:  The back appears normal and is  non-tender to palpation, there is no CVA tenderness EXT: Normal ROM in all joints; non-tender to palpation; no edema; normal capillary refill; no cyanosis, no calf tenderness or swelling    SKIN: Normal color for age and race; warm; no rash NEURO: Moves all extremities equally PSYCH: The patient's mood and manner are appropriate. Grooming and personal hygiene are appropriate.  MEDICAL DECISION MAKING: Patient here with complaints of abdominal pain, vomiting and diarrhea.  Differential includes incarcerated hernia, bowel obstruction, gastroenteritis, colitis, appendicitis, diverticulitis, UTI.  Labs obtained in triage are unremarkable.  Will obtain urinalysis, CT of the abdomen pelvis.  Will give IV fluids, pain and nausea medicine.  As for her difficulty with swallowing, have recommended follow-up with GI.  She is swallowing her secretions here without any difficulty.  Her posterior oropharynx appears normal.  She has no shortness of breath or cough.  No sore throat.  This seems to be a chronic issue for patient.  ED PROGRESS: Patient CT scan shows a fat-containing periumbilical hernia just superior to the umbilicus that is similar in size over the past couple of weeks.  There is some increased fluid within the hernia sac but diminished soft tissue stranding and no bowel involvement.  She has some free fluid in the abdomen which is likely physiologic with a corpus luteal cyst in the left ovary.  No sign of incarcerated hernia or bowel obstruction.  She states now she is feeling "much better".  Will p.o. challenge and discharge home with prescriptions of Zofran, Bentyl and instructions alternate Tylenol and Motrin and use Imodium over-the-counter as needed.  At this time, I do not feel there is any life-threatening condition present. I have reviewed and discussed all results (EKG, imaging, lab, urine as appropriate) and exam findings with patient/family. I have reviewed nursing notes and appropriate  previous records.  I feel the patient is safe to be discharged home without further emergent workup and can continue workup as an outpatient as needed. Discussed usual and customary return precautions. Patient/family verbalize understanding and are comfortable with this plan.  Outpatient follow-up has been provided as needed. All questions have been answered.      Ward, Delice Bison, DO 03/22/19 0207

## 2019-03-22 ENCOUNTER — Telehealth: Payer: Self-pay | Admitting: *Deleted

## 2019-03-22 ENCOUNTER — Encounter (HOSPITAL_COMMUNITY): Payer: Self-pay | Admitting: Radiology

## 2019-03-22 ENCOUNTER — Emergency Department (HOSPITAL_COMMUNITY): Payer: Self-pay

## 2019-03-22 LAB — URINALYSIS, ROUTINE W REFLEX MICROSCOPIC
Bilirubin Urine: NEGATIVE
Glucose, UA: NEGATIVE mg/dL
Hgb urine dipstick: NEGATIVE
Ketones, ur: 20 mg/dL — AB
Leukocytes,Ua: NEGATIVE
Nitrite: NEGATIVE
Protein, ur: NEGATIVE mg/dL
Specific Gravity, Urine: 1.021 (ref 1.005–1.030)
pH: 5 (ref 5.0–8.0)

## 2019-03-22 MED ORDER — IOHEXOL 300 MG/ML  SOLN
100.0000 mL | Freq: Once | INTRAMUSCULAR | Status: AC | PRN
  Administered 2019-03-22: 100 mL via INTRAVENOUS

## 2019-03-22 MED ORDER — DICYCLOMINE HCL 20 MG PO TABS: 20.0000 mg | ORAL_TABLET | Freq: Three times a day (TID) | ORAL | 0 refills | Status: DC | PRN

## 2019-03-22 MED ORDER — ONDANSETRON HCL 4 MG PO TABS: 4.0000 mg | ORAL_TABLET | Freq: Four times a day (QID) | ORAL | 0 refills | Status: DC | PRN

## 2019-03-22 NOTE — Discharge Instructions (Signed)
You may alternate Tylenol 1000 mg every 6 hours as needed for pain and Ibuprofen 800 mg every 8 hours as needed for pain.  Please take Ibuprofen with food. ° °You may use over-the-counter Imodium as needed for diarrhea. °

## 2019-03-22 NOTE — ED Notes (Signed)
Patient passed fluid challenge. 

## 2019-03-22 NOTE — Telephone Encounter (Signed)
Pt called regarding resources to help pay for a needed surgery.  EDCM advised that she keep appointment with [facility] on 10/7 @ 0930 so she can be enrolled in the [place] encouraged pt to visit [facility] web site for more information concerning the requirements for the orange card application.  Pt very appreciative of information.

## 2019-03-22 NOTE — ED Notes (Signed)
Patient verbalized understanding of discharge instructions. Opportunity for questions were provided. Pt. ambulatory and discharged home.  

## 2019-03-28 ENCOUNTER — Emergency Department (HOSPITAL_COMMUNITY)
Admission: EM | Admit: 2019-03-28 | Discharge: 2019-03-28 | Disposition: A | Discharge status: Home / Self Care | Payer: Self-pay | Attending: Emergency Medicine | Admitting: Emergency Medicine

## 2019-03-28 ENCOUNTER — Encounter (HOSPITAL_COMMUNITY): Payer: Self-pay | Admitting: Emergency Medicine

## 2019-03-28 DIAGNOSIS — Z87891 Personal history of nicotine dependence: Secondary | ICD-10-CM | POA: Insufficient documentation

## 2019-03-28 DIAGNOSIS — K429 Umbilical hernia without obstruction or gangrene: Secondary | ICD-10-CM | POA: Insufficient documentation

## 2019-03-28 DIAGNOSIS — I10 Essential (primary) hypertension: Secondary | ICD-10-CM | POA: Insufficient documentation

## 2019-03-28 LAB — CBC
HCT: 37.6 % (ref 36.0–46.0)
Hemoglobin: 11.9 g/dL — ABNORMAL LOW (ref 12.0–15.0)
MCH: 31.2 pg (ref 26.0–34.0)
MCHC: 31.6 g/dL (ref 30.0–36.0)
MCV: 98.4 fL (ref 80.0–100.0)
Platelets: 249 10*3/uL (ref 150–400)
RBC: 3.82 MIL/uL — ABNORMAL LOW (ref 3.87–5.11)
RDW: 13.5 % (ref 11.5–15.5)
WBC: 3.7 10*3/uL — ABNORMAL LOW (ref 4.0–10.5)
nRBC: 0 % (ref 0.0–0.2)

## 2019-03-28 LAB — COMPREHENSIVE METABOLIC PANEL
ALT: 12 U/L (ref 0–44)
AST: 12 U/L — ABNORMAL LOW (ref 15–41)
Albumin: 3.4 g/dL — ABNORMAL LOW (ref 3.5–5.0)
Alkaline Phosphatase: 115 U/L (ref 38–126)
Anion gap: 8 (ref 5–15)
BUN: 12 mg/dL (ref 6–20)
CO2: 25 mmol/L (ref 22–32)
Calcium: 9.2 mg/dL (ref 8.9–10.3)
Chloride: 105 mmol/L (ref 98–111)
Creatinine, Ser: 0.71 mg/dL (ref 0.44–1.00)
GFR calc Af Amer: >60 mL/min (ref >60)
GFR calc non Af Amer: >60 mL/min (ref >60)
Glucose, Bld: 100 mg/dL — ABNORMAL HIGH (ref 70–99)
Potassium: 4 mmol/L (ref 3.5–5.1)
Sodium: 138 mmol/L (ref 135–145)
Total Bilirubin: 0.5 mg/dL (ref 0.3–1.2)
Total Protein: 7.2 g/dL (ref 6.5–8.1)

## 2019-03-28 LAB — I-STAT BETA HCG BLOOD, ED (MC, WL, AP ONLY): I-stat hCG, quantitative: <5 m[IU]/mL (ref <5)

## 2019-03-28 LAB — LIPASE, BLOOD: Lipase: 29 U/L (ref 11–51)

## 2019-03-28 MED ORDER — OXYCODONE-ACETAMINOPHEN 5-325 MG PO TABS
1.0000 | ORAL_TABLET | ORAL | Status: AC | PRN
  Administered 2019-03-28 (×2): 1 via ORAL
  Filled 2019-03-28 (×2): qty 1

## 2019-03-28 MED ORDER — FENTANYL CITRATE (PF) 100 MCG/2ML IJ SOLN
50.0000 ug | Freq: Once | INTRAMUSCULAR | Status: AC
  Administered 2019-03-28: 50 ug via RESPIRATORY_TRACT
  Filled 2019-03-28: qty 2

## 2019-03-28 MED ORDER — SODIUM CHLORIDE 0.9% FLUSH: 3.0000 mL | Freq: Once | INTRAVENOUS | Status: DC

## 2019-03-28 NOTE — ED Triage Notes (Signed)
Pt states she has had a hernia for "years" but since 9/22 when she was also in the ER the pain has been worse. Pt has been seen at [facility] but due to lack of insurance unable to afford the surgery at this point.

## 2019-03-28 NOTE — ED Notes (Addendum)
IV attempted by this RN. EDP notified; requested change of administration route for pain medication.

## 2019-03-28 NOTE — ED Notes (Signed)
Wasted 35mcgs fentanyl with Levada Dy, RN

## 2019-03-28 NOTE — Discharge Instructions (Signed)
Please call and follow up with general surgery for elective repair of her recurrent umbilical hernia.  Return if you have any concerns.

## 2019-03-28 NOTE — ED Notes (Signed)
Wasted 90mcgs fentanyl with Ronalee Belts, Therapist, sports.

## 2019-03-28 NOTE — ED Provider Notes (Signed)
[facility] EMERGENCY DEPARTMENT Provider Note   CSN: [id number] Arrival date & time: 03/28/19  1154     History   Chief Complaint Chief Complaint  Patient presents with  . Hernia    HPI Theresa Burke is a 38 y.o. female.     The history is provided by the patient and medical records. No language interpreter was used.     38 year old female with history of anxiety, recurrent umbilical hernia presenting for evaluation of periumbilical pain.  Patient admits that she has had recurrent pain to her periumbilical region for "quite a while".  She has been seen in the ED several times for the same complaint most recent was on 9/22 for her periumbilical pain.  At that time, she had a CT scan that demonstrated a fat-containing periumbilical hernia similar side.  The hernia was reduced in the ED and patient felt better.  However she report the pain has since returned.  States pain is been waxing waning and she has noticed a bulge at the umbilical site.  Pain worsening several days.  She endorsed mild nausea without vomiting.  She has normal bowel movement.  She denies any fever or chills.  She mention she needs to have her hernia repaired but does not have insurance to have it done, prompting her ER visit.  She denies any provocative factor.  She denies any recent sick contact.  Past Medical History:  Diagnosis Date  . Anxiety   . Hypertension     Patient Active Problem List   Diagnosis Date Noted  . Chest wall pain 12/01/2018    Past Surgical History:  Procedure Laterality Date  . NO PAST SURGERIES       OB History   No obstetric history on file.      Home Medications    Prior to Admission medications   Medication Sig Start Date End Date Taking? Authorizing Provider  busPIRone (BUSPAR) 7.5 MG tablet Take 1 tablet (7.5 mg total) by mouth 2 (two) times daily. May increase to 3 x a day.  May double dose. Patient not taking: Reported on 03/22/2019 11/25/18    Raylene Everts, MD  dicyclomine (BENTYL) 20 MG tablet Take 1 tablet (20 mg total) by mouth every 8 (eight) hours as needed for spasms (Abdominal cramping). 03/22/19   Ward, Delice Bison, DO  esomeprazole (NEXIUM) 40 MG capsule Take 40 mg by mouth daily. 01/23/19   [provider]  famotidine (PEPCID) 20 MG tablet Take 1 tablet (20 mg total) by mouth 2 (two) times daily. FOR 6 WEEKS THEN STOP Patient not taking: Reported on 03/22/2019 12/01/18   Leonie Man, MD  ibuprofen (ADVIL) 600 MG tablet Take 1 tablet (600 mg total) by mouth every 6 (six) hours as needed. 12/15/18   Melynda Ripple, MD  mupirocin ointment (BACTROBAN) 2 % Apply 1 application topically 3 (three) times daily. Patient not taking: Reported on 03/22/2019 12/15/18   Melynda Ripple, MD  ondansetron [facility]) 4 MG tablet Take 1 tablet (4 mg total) by mouth every 6 (six) hours as needed for nausea or vomiting. 03/22/19   Ward, Delice Bison, DO  pantoprazole (PROTONIX) 20 MG tablet Take 1 tablet (20 mg total) by mouth daily. Patient not taking: Reported on 03/22/2019 12/15/18   Melynda Ripple, MD    Family History Family History  Problem Relation Age of Onset  . Cancer Mother   . Other Other        doesn't really know -  but no h/o CAD, HLD HTN or DM.     Social History Social History   Tobacco Use  . Smoking status: Former Games developer  . Smokeless tobacco: Never Used  Substance Use Topics  . Alcohol use: Yes    Comment: social  . Drug use: Never     Allergies   Patient has no known allergies.   Review of Systems Review of Systems  All other systems reviewed and are negative.    Physical Exam Updated Vital Signs BP 123/70 (BP Location: Left Wrist)   Pulse 63   Temp 97.7 F (36.5 C) (Oral)   Resp 16   LMP 03/05/2019 (Exact Date)   SpO2 100%   Physical Exam Vitals signs and nursing note reviewed.  Constitutional:      General: She is not in acute distress.    Appearance: She is well-developed.      Comments: Patient is tearful but nontoxic  HENT:     Head: Atraumatic.  Eyes:     Conjunctiva/sclera: Conjunctivae normal.  Neck:     Musculoskeletal: Neck supple.  Cardiovascular:     Rate and Rhythm: Normal rate and regular rhythm.     Pulses: Normal pulses.     Heart sounds: Normal heart sounds.  Pulmonary:     Effort: Pulmonary effort is normal.     Breath sounds: Normal breath sounds.  Abdominal:     Palpations: Abdomen is soft.     Hernia: A hernia (Palpable umbilical hernia noted with tenderness to palpation.) is present.  Skin:    Findings: No rash.  Neurological:     Mental Status: She is alert.      ED Treatments / Results  Labs (all labs ordered are listed, but only abnormal results are displayed) Labs Reviewed  COMPREHENSIVE METABOLIC PANEL - Abnormal; Notable for the following components:      Result Value   Glucose, Bld 100 (*)    Albumin 3.4 (*)    AST 12 (*)    All other components within normal limits  CBC - Abnormal; Notable for the following components:   WBC 3.7 (*)    RBC 3.82 (*)    Hemoglobin 11.9 (*)    All other components within normal limits  LIPASE, BLOOD  URINALYSIS, ROUTINE W REFLEX MICROSCOPIC  I-STAT BETA HCG BLOOD, ED ([facility], [facility], [facility] ONLY)    EKG None  Radiology No results found.  Procedures Hernia reduction  Date/Time: 03/28/2019 5:55 PM Performed by: Fayrene Helper, PA-C Authorized by: Fayrene Helper, PA-C  Consent: Verbal consent obtained. Risks and benefits: risks, benefits and alternatives were discussed Consent given by: patient Patient understanding: patient states understanding of the procedure being performed Patient consent: the patient's understanding of the procedure matches consent given Patient identity confirmed: verbally with patient and arm band Time out: Immediately prior to procedure a "time out" was called to verify the correct patient, procedure, equipment, support staff and site/side marked as required. Local  anesthesia used: no  Anesthesia: Local anesthesia used: no  Sedation: Patient sedated: no  Patient tolerance: patient tolerated the procedure well with no immediate complications Comments: Reduction of periumbilical hernia.  Pt was given Fentanyl.  Direct pressure were exerted to herniated site using both hands.  Hernia reduced without any complication.  Pt tolerates with some discomfort.     (including critical care time)  Medications Ordered in ED Medications  sodium chloride flush (NS) 0.9 % injection 3 mL (3 mLs Intravenous Not Given 03/28/19 1712)  oxyCODONE-acetaminophen (PERCOCET/ROXICET) 5-325 MG per tablet 1 tablet (1 tablet Oral Given 03/28/19 1715)  fentaNYL (SUBLIMAZE) injection 50 mcg (50 mcg Inhalation Given 03/28/19 1740)     Initial Impression / Assessment and Plan / ED Course  I have reviewed the triage vital signs and the nursing notes.  Pertinent labs & imaging results that were available during my care of the patient were reviewed by me and considered in my medical decision making (see chart for details).        BP 123/70 (BP Location: Left Wrist)   Pulse 63   Temp 97.7 F (36.5 C) (Oral)   Resp 16   LMP 03/05/2019 (Exact Date)   SpO2 100%    Final Clinical Impressions(s) / ED Diagnoses   Final diagnoses:  Periumbilical hernia    ED Discharge Orders    None     5:16 PM Patient here with periumbilical abdominal pain consistent with hernia.  I have low suspicion for strangulation or incarceration as it is a fat filled hernia on previous CT scan done less than a week ago.  Patient would benefit from outpatient elective surgery to repair her hernia.  Will perform hernia reduction in the ER.  Pain medication given.  5:56 PM Successful reduction of hernia by me.  Pt d/c and recommended for outpt f/u with General Surgery for elective hernia repair.  Return precaution discussed.    Fayrene Helperran, Halima Fogal, PA-C 03/28/19 Re[address]eca [id number]    Zackowski, Scott, MD 04/10/19  [phone]

## 2019-04-05 ENCOUNTER — Other Ambulatory Visit: Payer: Self-pay

## 2019-04-05 ENCOUNTER — Encounter: Payer: Self-pay | Admitting: Family Medicine

## 2019-04-05 ENCOUNTER — Ambulatory Visit: Payer: Self-pay | Attending: Family Medicine | Admitting: Family Medicine

## 2019-04-05 VITALS — BP 112/76 | HR 87 | Temp 98.0°F | Resp 18 | Ht 64.0 in | Wt 240.0 lb

## 2019-04-05 DIAGNOSIS — K429 Umbilical hernia without obstruction or gangrene: Secondary | ICD-10-CM

## 2019-04-05 DIAGNOSIS — K219 Gastro-esophageal reflux disease without esophagitis: Secondary | ICD-10-CM

## 2019-04-05 MED ORDER — ESOMEPRAZOLE MAGNESIUM 40 MG PO CPDR: 40.0000 mg | DELAYED_RELEASE_CAPSULE | Freq: Every day | ORAL | 3 refills | Status: DC

## 2019-04-05 MED FILL — ESOMEPRAZOLE MAG DR 40 MG C: 40 | 30 days supply | Qty: 30 | Fill #0

## 2019-04-05 NOTE — Progress Notes (Signed)
Subjective:  Patient ID: Theresa RosserIronda Burke, female    DOB: [birth year]  Age: 38 y.o. MRN: [id number]  CC: Hospitalization Follow-up (periumbilical hernia)   HPI Theresa Rosserronda Burke presents for follow-up of ED on 03/28/2019  visit secondary to umbilical hernia.  She reports that at the time of the emergency department visit she was having pain in the area of her hernia and she states that the hernia was reduced in the emergency department.  She reports no recurrence of pain since that time but she would like to be referred to surgery for correction of the hernia.  She has had some issues with recurrent nausea but she believes that this may be related to acid reflux that she has also had some occasional burping/belching as well as sensation of backwash of bad tasting liquid into her throat especially when lying down at night.  She occasionally has an upper abdominal burning sensation.  She has taken medication for acid reflux in the past but the only thing that seemed to help was Nexium.  She also states that when she gets the pain from her hernia, it tends to be a burning and pulling sensation.  Past Medical History:  Diagnosis Date  . Anxiety   . Hypertension     Past Surgical History:  Procedure Laterality Date  . NO PAST SURGERIES      Family History  Problem Relation Age of Onset  . Cancer Mother   . Cervical cancer Mother   . Other Other        doesn't really know - but no h/o CAD, HLD HTN or DM.   Marland Kitchen. Hypertension Sister     Social History   Tobacco Use  . Smoking status: Former Games developermoker  . Smokeless tobacco: Never Used  Substance Use Topics  . Alcohol use: Yes    Comment: social    ROS Review of Systems  Constitutional: Positive for fatigue (occasional). Negative for chills and fever.  HENT: Negative for sore throat and trouble swallowing.   Eyes: Negative for photophobia and visual disturbance.  Respiratory: Negative for cough and shortness of breath.   Cardiovascular: Negative  for chest pain, palpitations and leg swelling.  Gastrointestinal: Positive for abdominal pain (recurrent due to umbilical hernia) and nausea. Negative for blood in stool, constipation and diarrhea.  Endocrine: Negative for cold intolerance, heat intolerance, polydipsia and polyphagia.  Genitourinary: Negative for dysuria, flank pain and frequency.  Musculoskeletal: Negative for arthralgias and back pain.  Neurological: Negative for dizziness and headaches.  Hematological: Negative for adenopathy. Does not bruise/bleed easily.  Psychiatric/Behavioral: Negative for self-injury and suicidal ideas. The patient is nervous/anxious (occasional).     Objective:   Today's Vitals: BP 112/76 (BP Location: Right Arm, Patient Position: Sitting, Cuff Size: Large)   Pulse 87   Temp 98 F (36.7 C) (Oral)   Resp 18   Ht 5\' 4"  (1.626 m)   Wt 240 lb (108.9 kg)   LMP 03/19/2019   SpO2 97%   BMI 41.20 kg/m   Physical Exam Vitals signs and nursing note reviewed.  Constitutional:      General: She is not in acute distress.    Appearance: Normal appearance. She is obese. She is not ill-appearing.  Neck:     Musculoskeletal: Normal range of motion and neck supple. No neck rigidity or muscular tenderness.     Vascular: No carotid bruit.  Cardiovascular:     Rate and Rhythm: Normal rate and regular rhythm.  Pulmonary:  Effort: Pulmonary effort is normal.     Breath sounds: Normal breath sounds.  Abdominal:     Palpations: Abdomen is soft.     Tenderness: There is no abdominal tenderness. There is no right CVA tenderness, left CVA tenderness, guarding or rebound.     Hernia: No hernia is present.  Lymphadenopathy:     Cervical: No cervical adenopathy.  Neurological:     Mental Status: She is alert.     Assessment & Plan:  1. Periumbilical hernia Patient has had CT scan 03/22/2019 showing fat-containing periumbilical hernia just superior to the umbilicus which was the same as CT done on  03/19/2019.  Patient did have some mild increase in fluid in the hernia sac with diminished soft tissue stranding as compared to 03/06/2019 CT scan.  Referral is being placed for patient to follow-up with general surgery regarding repair of her umbilical hernia.  Educational information on hernias is provided as part of after visit summary.  Patient is aware that if she has increased abdominal pain at this site or any concerns in the meantime she should go to the emergency department for further evaluation.  Patient's ED visit notes from 03/28/2019 reviewed.  Patient is to apply for financial assistance program through this office to help with the cost of surgery. - Ambulatory referral to General Surgery  2. Gastroesophageal reflux disease, unspecified whether esophagitis present Patient reports longstanding issues with acid reflux and the only medication that has helped so far as Nexium 40 mg.  Refill provided and sent to this pharmacy at today's visit.  Handout given on reflux diet as part of AVS.  Patient encouraged to avoid late night eating as well as avoidance of known trigger foods. - esomeprazole (NEXIUM) 40 MG capsule; Take 1 capsule (40 mg total) by mouth daily.  Dispense: 90 capsule; Refill: 3   Outpatient Encounter Medications as of 04/05/2019  Medication Sig  . esomeprazole (NEXIUM) 40 MG capsule Take 1 capsule (40 mg total) by mouth daily.  . [DISCONTINUED] esomeprazole (NEXIUM) 40 MG capsule Take 40 mg by mouth daily.  . [DISCONTINUED] busPIRone (BUSPAR) 7.5 MG tablet Take 1 tablet (7.5 mg total) by mouth 2 (two) times daily. May increase to 3 x a day.  May double dose. (Patient not taking: Reported on 03/22/2019)  . [DISCONTINUED] dicyclomine (BENTYL) 20 MG tablet Take 1 tablet (20 mg total) by mouth every 8 (eight) hours as needed for spasms (Abdominal cramping). (Patient not taking: Reported on 04/05/2019)  . [DISCONTINUED] famotidine (PEPCID) 20 MG tablet Take 1 tablet (20 mg total) by mouth  2 (two) times daily. FOR 6 WEEKS THEN STOP (Patient not taking: Reported on 03/22/2019)  . [DISCONTINUED] ibuprofen (ADVIL) 600 MG tablet Take 1 tablet (600 mg total) by mouth every 6 (six) hours as needed. (Patient not taking: Reported on 04/05/2019)  . [DISCONTINUED] mupirocin ointment (BACTROBAN) 2 % Apply 1 application topically 3 (three) times daily. (Patient not taking: Reported on 03/22/2019)  . [DISCONTINUED] ondansetron (ZOFRAN) 4 MG tablet Take 1 tablet (4 mg total) by mouth every 6 (six) hours as needed for nausea or vomiting. (Patient not taking: Reported on 04/05/2019)  . [DISCONTINUED] pantoprazole (PROTONIX) 20 MG tablet Take 1 tablet (20 mg total) by mouth daily. (Patient not taking: Reported on 03/22/2019)   No facility-administered encounter medications on file as of 04/05/2019.     An After Visit Summary was printed and given to the patient.  Follow-up: Return in about 6 weeks (around 05/17/2019) for  GERD/Hernia.    Cain Saupe MD

## 2019-04-05 NOTE — Patient Instructions (Signed)
Food Choices for Gastroesophageal Reflux Disease, Adult When you have gastroesophageal reflux disease (GERD), the foods you eat and your eating habits are very important. Choosing the right foods can help ease your discomfort. Think about working with a nutrition specialist (dietitian) to help you make good choices. What are tips for following this plan?  Meals  Choose healthy foods that are low in fat, such as fruits, vegetables, whole grains, low-fat dairy products, and lean meat, fish, and poultry.  Eat small meals often instead of 3 large meals a day. Eat your meals slowly, and in a place where you are relaxed. Avoid bending over or lying down until 2-3 hours after eating.  Avoid eating meals 2-3 hours before bed.  Avoid drinking a lot of liquid with meals.  Cook foods using methods other than frying. Bake, grill, or broil food instead.  Avoid or limit: ? Chocolate. ? Peppermint or spearmint. ? Alcohol. ? Pepper. ? Black and decaffeinated coffee. ? Black and decaffeinated tea. ? Bubbly (carbonated) soft drinks. ? Caffeinated energy drinks and soft drinks.  Limit high-fat foods such as: ? Fatty meat or fried foods. ? Whole milk, cream, butter, or ice cream. ? Nuts and nut butters. ? Pastries, donuts, and sweets made with butter or shortening.  Avoid foods that cause symptoms. These foods may be different for everyone. Common foods that cause symptoms include: ? Tomatoes. ? Oranges, lemons, and limes. ? Peppers. ? Spicy food. ? Onions and garlic. ? Vinegar. Lifestyle  Maintain a healthy weight. Ask your doctor what weight is healthy for you. If you need to lose weight, work with your doctor to do so safely.  Exercise for at least 30 minutes for 5 or more days each week, or as told by your doctor.  Wear loose-fitting clothes.  Do not smoke. If you need help quitting, ask your doctor.  Sleep with the head of your bed higher than your feet. Use a wedge under the  mattress or blocks under the bed frame to raise the head of the bed. Summary  When you have gastroesophageal reflux disease (GERD), food and lifestyle choices are very important in easing your symptoms.  Eat small meals often instead of 3 large meals a day. Eat your meals slowly, and in a place where you are relaxed.  Limit high-fat foods such as fatty meat or fried foods.  Avoid bending over or lying down until 2-3 hours after eating.  Avoid peppermint and spearmint, caffeine, alcohol, and chocolate. This information is not intended to replace advice given to you by your health care provider. Make sure you discuss any questions you have with your health care provider. Document Released: 12/15/2011 Document Revised: 10/06/2018 Document Reviewed: 07/21/2016 Elsevier Patient Education  2020 [employer]  Hernia, Adult     A hernia happens when tissue inside your body pushes out through a weak spot in your belly muscles (abdominal wall). This makes a round lump (bulge). The lump may be:  In a scar from surgery that was done in your belly (incisional hernia).  Near your belly button (umbilical hernia).  In your groin (inguinal hernia). Your groin is the area where your leg meets your lower belly (abdomen). This kind of hernia could also be: ? In your scrotum, if you are female. ? In folds of skin around your vagina, if you are female.  In your upper thigh (femoral hernia).  Inside your belly (hiatal hernia). This happens when your stomach slides above the muscle between  your belly and your chest (diaphragm). If your hernia is small and it does not cause pain, you may not need treatment. If your hernia is large or it causes pain, you may need surgery. Follow these instructions at home: Activity  Avoid stretching or overusing (straining) the muscles near your hernia. Straining can happen when you: ? Lift something heavy. ? Poop (have a bowel movement).  Do not lift anything that is  heavier than 10 lb (4.5 kg), or the limit that you are told, until your doctor says that it is safe.  Use the strength of your legs when you lift something heavy. Do not use only your back muscles to lift. General instructions  Do these things if told by your doctor so you do not have trouble pooping (constipation): ? Drink enough fluid to keep your pee (urine) pale yellow. ? Eat foods that are high in fiber. These include fresh fruits and vegetables, whole grains, and beans. ? Limit foods that are high in fat and processed sugars. These include foods that are fried or sweet. ? Take medicine for trouble pooping.  When you cough, try to cough gently.  You may try to push your hernia in by very gently pressing on it when you are lying down. Do not try to force the bulge back in if it will not push in easily.  If you are overweight, work with your doctor to lose weight safely.  Do not use any products that have nicotine or tobacco in them. These include cigarettes and e-cigarettes. If you need help quitting, ask your doctor.  If you will be having surgery (hernia repair), watch your hernia for changes in shape, size, or color. Tell your doctor if you see any changes.  Take over-the-counter and prescription medicines only as told by your doctor.  Keep all follow-up visits as told by your doctor. Contact a doctor if:  You get new pain, swelling, or redness near your hernia.  You poop fewer times in a week than normal.  You have trouble pooping.  You have poop (stool) that is more dry than normal.  You have poop that is harder or larger than normal. Get help right away if:  You have a fever.  You have belly pain that gets worse.  You feel sick to your stomach (nauseous).  You throw up (vomit).  Your hernia cannot be pushed in by very gently pressing on it when you are lying down. Do not try to force the bulge back in if it will not push in easily.  Your hernia: ? Changes in  shape or size. ? Changes color. ? Feels hard or it hurts when you touch it. These symptoms may represent a serious problem that is an emergency. Do not wait to see if the symptoms will go away. Get medical help right away. Call your local emergency services (911 in the [place] Summary  A hernia happens when tissue inside your body pushes out through a weak spot in the belly muscles. This creates a bulge.  If your hernia is small and it does not hurt, you may not need treatment. If your hernia is large or it hurts, you may need surgery.  If you will be having surgery, watch your hernia for changes in shape, size, or color. Tell your doctor about any changes. This information is not intended to replace advice given to you by your health care provider. Make sure you discuss any questions you have with your health  care provider. Document Released: 12/03/2009 Document Revised: 10/06/2018 Document Reviewed: 03/17/2017 Elsevier Patient Education  2020 [employer].

## 2019-04-12 ENCOUNTER — Ambulatory Visit: Payer: Self-pay | Attending: Family Medicine

## 2019-04-12 ENCOUNTER — Other Ambulatory Visit: Payer: Self-pay

## 2019-04-23 ENCOUNTER — Encounter (HOSPITAL_COMMUNITY): Payer: Self-pay | Admitting: Emergency Medicine

## 2019-04-23 ENCOUNTER — Emergency Department (HOSPITAL_COMMUNITY): Payer: Self-pay

## 2019-04-23 ENCOUNTER — Other Ambulatory Visit: Payer: Self-pay

## 2019-04-23 ENCOUNTER — Emergency Department (HOSPITAL_COMMUNITY)
Admission: EM | Admit: 2019-04-23 | Discharge: 2019-04-23 | Disposition: A | Discharge status: Home / Self Care | Payer: Self-pay | Attending: Emergency Medicine | Admitting: Emergency Medicine

## 2019-04-23 ENCOUNTER — Other Ambulatory Visit (HOSPITAL_COMMUNITY): Payer: Self-pay

## 2019-04-23 DIAGNOSIS — R1010 Upper abdominal pain, unspecified: Secondary | ICD-10-CM

## 2019-04-23 DIAGNOSIS — R0789 Other chest pain: Secondary | ICD-10-CM | POA: Insufficient documentation

## 2019-04-23 DIAGNOSIS — I10 Essential (primary) hypertension: Secondary | ICD-10-CM | POA: Insufficient documentation

## 2019-04-23 DIAGNOSIS — R1013 Epigastric pain: Secondary | ICD-10-CM | POA: Insufficient documentation

## 2019-04-23 DIAGNOSIS — Z87891 Personal history of nicotine dependence: Secondary | ICD-10-CM | POA: Insufficient documentation

## 2019-04-23 DIAGNOSIS — Z79899 Other long term (current) drug therapy: Secondary | ICD-10-CM | POA: Insufficient documentation

## 2019-04-23 LAB — HEPATIC FUNCTION PANEL
ALT: 12 U/L (ref 0–44)
AST: 13 U/L — ABNORMAL LOW (ref 15–41)
Albumin: 3.6 g/dL (ref 3.5–5.0)
Alkaline Phosphatase: 107 U/L (ref 38–126)
Bilirubin, Direct: <0.1 mg/dL (ref 0.0–0.2)
Total Bilirubin: 0.9 mg/dL (ref 0.3–1.2)
Total Protein: 7.4 g/dL (ref 6.5–8.1)

## 2019-04-23 LAB — BASIC METABOLIC PANEL
Anion gap: 8 (ref 5–15)
BUN: 11 mg/dL (ref 6–20)
CO2: 24 mmol/L (ref 22–32)
Calcium: 9.1 mg/dL (ref 8.9–10.3)
Chloride: 105 mmol/L (ref 98–111)
Creatinine, Ser: 0.77 mg/dL (ref 0.44–1.00)
GFR calc Af Amer: >60 mL/min (ref >60)
GFR calc non Af Amer: >60 mL/min (ref >60)
Glucose, Bld: 97 mg/dL (ref 70–99)
Potassium: 3.8 mmol/L (ref 3.5–5.1)
Sodium: 137 mmol/L (ref 135–145)

## 2019-04-23 LAB — I-STAT BETA HCG BLOOD, ED (MC, WL, AP ONLY): I-stat hCG, quantitative: <5 m[IU]/mL (ref <5)

## 2019-04-23 LAB — TROPONIN I (HIGH SENSITIVITY)
Troponin I (High Sensitivity): <2 ng/L (ref <18)
Troponin I (High Sensitivity): <2 ng/L (ref <18)

## 2019-04-23 LAB — LIPASE, BLOOD: Lipase: 23 U/L (ref 11–51)

## 2019-04-23 LAB — CBC
HCT: 36.7 % (ref 36.0–46.0)
Hemoglobin: 11.4 g/dL — ABNORMAL LOW (ref 12.0–15.0)
MCH: 29.8 pg (ref 26.0–34.0)
MCHC: 31.1 g/dL (ref 30.0–36.0)
MCV: 95.8 fL (ref 80.0–100.0)
Platelets: 247 10*3/uL (ref 150–400)
RBC: 3.83 MIL/uL — ABNORMAL LOW (ref 3.87–5.11)
RDW: 13.2 % (ref 11.5–15.5)
WBC: 4.4 10*3/uL (ref 4.0–10.5)
nRBC: 0 % (ref 0.0–0.2)

## 2019-04-23 MED ORDER — ALUM & MAG HYDROXIDE-SIMETH 200-200-20 MG/5ML PO SUSP
30.0000 mL | Freq: Once | ORAL | Status: AC
  Administered 2019-04-23: 30 mL via ORAL
  Filled 2019-04-23: qty 30

## 2019-04-23 MED ORDER — SUCRALFATE 1 GM/10ML PO SUSP: 1.0000 g | Freq: Three times a day (TID) | ORAL | 0 refills | Status: DC

## 2019-04-23 MED ORDER — LIDOCAINE VISCOUS HCL 2 % MT SOLN
15.0000 mL | Freq: Once | OROMUCOSAL | Status: AC
  Administered 2019-04-23: 15 mL via ORAL
  Filled 2019-04-23: qty 15

## 2019-04-23 MED ORDER — FAMOTIDINE IN NACL 20-0.9 MG/50ML-% IV SOLN
20.0000 mg | Freq: Once | INTRAVENOUS | Status: AC
  Administered 2019-04-23: 20 mg via INTRAVENOUS
  Filled 2019-04-23: qty 50

## 2019-04-23 NOTE — ED Provider Notes (Signed)
[facility] Provider Note   CSN: [id number] Arrival date & time: 04/23/19  4196     History   Chief Complaint Chief Complaint  Patient presents with  . Chest Pain    HPI Theresa Burke is a 38 y.o. female presenting for evaluation of epigastric pain and chest burning.  Patient states symptoms worsened around midnight last night.  She reports burning of her central chest and pain in her mid/upper abdomen.  She reports history of severe GERD, takes esomeprazole for this.  She has been taking it as prescribed without improvement.  She reports continued dysphagia feeling.  She denies fevers, chills, shortness of breath, cough, nausea, vomiting, urinary symptoms.  Patient states she did have a bowel movement yesterday which is abnormal for her, although she had a normal bowel movement 2 days ago.  She denies previous history of abdominal problems.  No previous abdominal surgeries.  She takes no medicine other than esomeprazole.     HPI  Past Medical History:  Diagnosis Date  . Anxiety   . Hypertension     Patient Active Problem List   Diagnosis Date Noted  . Chest wall pain 12/01/2018    Past Surgical History:  Procedure Laterality Date  . NO PAST SURGERIES       OB History   No obstetric history on file.      Home Medications    Prior to Admission medications   Medication Sig Start Date End Date Taking? Authorizing Provider  esomeprazole (NEXIUM) 40 MG capsule Take 1 capsule (40 mg total) by mouth daily. 04/05/19   Antony Blackbird, MD    Family History Family History  Problem Relation Age of Onset  . Cancer Mother   . Cervical cancer Mother   . Other Other        doesn't really know - but no h/o CAD, HLD HTN or DM.   Marland Kitchen Hypertension Sister     Social History Social History   Tobacco Use  . Smoking status: Former Research scientist (life sciences)  . Smokeless tobacco: Never Used  Substance Use Topics  . Alcohol use: Yes    Comment: social  .  Drug use: Never     Allergies   Patient has no known allergies.   Review of Systems Review of Systems  Cardiovascular: Positive for chest pain (chest burning).  Gastrointestinal: Positive for abdominal pain.  All other systems reviewed and are negative.    Physical Exam Updated Vital Signs Pulse 79   Temp (!) 97.5 F (36.4 C) (Oral)   Resp 18   Ht 5\' 4"  (1.626 m)   Wt 104.3 kg   SpO2 100%   BMI 39.48 kg/m   Physical Exam Vitals signs and nursing note reviewed.  Constitutional:      General: She is not in acute distress.    Appearance: She is well-developed.     Comments: Resting comfortably in the bed in no acute distress  HENT:     Head: Normocephalic and atraumatic.  Eyes:     Extraocular Movements: Extraocular movements intact.     Conjunctiva/sclera: Conjunctivae normal.     Pupils: Pupils are equal, round, and reactive to light.  Neck:     Musculoskeletal: Normal range of motion and neck supple.  Cardiovascular:     Rate and Rhythm: Normal rate and regular rhythm.     Pulses: Normal pulses.  Pulmonary:     Effort: Pulmonary effort is normal. No respiratory distress.  Breath sounds: Normal breath sounds. No wheezing.     Comments: Clear lung sounds in all fields.  Abdominal:     General: There is no distension.     Palpations: Abdomen is soft. There is no mass.     Tenderness: There is abdominal tenderness. There is no guarding or rebound.     Comments: Tenderness palpation of epigastric and right upper quadrant abdomen.  No rigidity, guarding, distention.  Negative rebound.  Musculoskeletal: Normal range of motion.     Right lower leg: No edema.     Left lower leg: No edema.  Skin:    General: Skin is warm and dry.     Capillary Refill: Capillary refill takes less than 2 seconds.  Neurological:     Mental Status: She is alert and oriented to person, place, and time.      ED Treatments / Results  Labs (all labs ordered are listed, but only  abnormal results are displayed) Labs Reviewed  CBC - Abnormal; Notable for the following components:      Result Value   RBC 3.83 (*)    Hemoglobin 11.4 (*)    All other components within normal limits  HEPATIC FUNCTION PANEL - Abnormal; Notable for the following components:   AST 13 (*)    All other components within normal limits  BASIC METABOLIC PANEL  LIPASE, BLOOD  I-STAT BETA HCG BLOOD, ED ([facility], [facility], [facility] ONLY)  TROPONIN I (HIGH SENSITIVITY)  TROPONIN I (HIGH SENSITIVITY)    EKG None  Radiology Dg Chest 2 View  Result Date: 04/23/2019 CLINICAL DATA:  Chest pain EXAM: CHEST - 2 VIEW COMPARISON:  December 10, 2018 FINDINGS: Lungs are clear. Heart size and pulmonary vascularity are normal. No adenopathy. No pneumothorax. No bone lesions. IMPRESSION: No edema or consolidation. Electronically Signed   By: Bretta Bang III M.D.   On: 04/23/2019 08:56    Procedures Procedures (including critical care time)  Medications Ordered in ED Medications  famotidine (PEPCID) IVPB 20 mg premix (20 mg Intravenous Incomplete 04/23/19 0937)  alum & mag hydroxide-simeth (MAALOX/MYLANTA) 200-200-20 MG/5ML suspension 30 mL (30 mLs Oral Given 04/23/19 0912)    And  lidocaine (XYLOCAINE) 2 % viscous mouth solution 15 mL (15 mLs Oral Given 04/23/19 0913)     Initial Impression / Assessment and Plan / ED Course  I have reviewed the triage vital signs and the nursing notes.  Pertinent labs & imaging results that were available during my care of the patient were reviewed by me and considered in my medical decision making (see chart for details).     Patient with evaluation of epigastric abdominal chest burning.  Physical exam shows patient who appears nontoxic.  Reproducible tenderness palpation in the epigastric and right upper quadrant abdomen.  Symptoms began at night, and chest pain is described as burning, patient reports a history of GERD, high suspicion for GERD/stomach pathology.  However  as the pain is also right upper quadrant, will obtain ultrasound to rule out gallbladder pathology.  Will obtain abdominal labs, troponin, EKG and x-ray.  Pepcid and GI cocktail for symptom control   Chest x-ray viewed interpreted by me, no pneumonia comparison effusion.  EKG without STEMI. ruq [place] negative for gallstones, shows mild hepatic steatosis.  Troponin less than 2.  Lipase and liver function normal.  On reassessment, patient reports pain is improved with Pepcid and GI cocktail.  I have a strong suspicion that this is gastric in etiology, not cardiac.  Discussed with patient, who is in agreement.  Discussed follow-up with GI if symptoms persist.  Patient to continue taking her esomeprazole and add Carafate as needed.  At this time, patient appears safe for discharge.  Return precautions given.  Patient states she understands and agrees to plan.  Final Clinical Impressions(s) / ED Diagnoses   Final diagnoses:  Upper abdominal pain    ED Discharge Orders    None       Alveria ApleyCaccavale, Lendy Dittrich, PA-C 04/23/19 1525    Little, Ambrose Finlandachel Morgan, MD 04/25/19 1336

## 2019-04-23 NOTE — Discharge Instructions (Addendum)
Continue taking home medications as prescribed.  Use the carafate before meals and before bed for symptom control. Avoid anti-inflammatories such as ibuprofen, naproxen, Aleve.  Use Tylenol as needed for pain. Make sure stay well-hydrated water. Make sure you remain upright for at least 60 minutes after eating before laying flat. Follow-up with the stomach doctors listed below for further evaluation of your symptoms. Return to the emergency if you develop high fevers, persistent vomiting, severe worsening pain, or any new, worsening, or concerning symptoms.

## 2019-04-23 NOTE — ED Notes (Signed)
Patient Alert and oriented to baseline. Stable and ambulatory to baseline. Patient verbalized understanding of the discharge instructions.  Patient belongings were taken by the patient.   

## 2019-04-23 NOTE — ED Notes (Signed)
Patient transported to X-ray 

## 2019-04-23 NOTE — ED Triage Notes (Signed)
Pt reports burning in her stomach . Pt reports she wants to speak with her doctor about a x-ray of stomach.

## 2019-04-23 NOTE — ED Triage Notes (Signed)
Pt c/o 10/10 central cp and epigastric pain since last night, denies SOB, nausea or vomiting.

## 2019-04-23 NOTE — ED Notes (Signed)
Gave pt "ER Happy Meal" and water, per Sophia - PA.

## 2019-04-28 ENCOUNTER — Telehealth: Payer: Self-pay | Admitting: General Practice

## 2019-04-28 ENCOUNTER — Other Ambulatory Visit: Payer: Self-pay | Admitting: Family Medicine

## 2019-04-28 DIAGNOSIS — R1310 Dysphagia, unspecified: Secondary | ICD-10-CM

## 2019-04-28 DIAGNOSIS — K219 Gastro-esophageal reflux disease without esophagitis: Secondary | ICD-10-CM

## 2019-04-28 NOTE — Telephone Encounter (Signed)
Patient called stating she needs a referral for [facility]. Please follow up

## 2019-04-28 NOTE — Telephone Encounter (Signed)
Please notify patient that GI referral placed and she should call back in about 2 weeks if she has not heard anything regarding her GI referral

## 2019-04-28 NOTE — Telephone Encounter (Signed)
Per pt she discussed issue with acid reflux and stomach issues and swallowing problems with the provider in the past. Per pt that's why she is asking for the referral.

## 2019-04-28 NOTE — Progress Notes (Signed)
Patient ID: Theresa Burke, female   DOB: [birth year], 38 y.o.   MRN: [id number]   Phone message received that patient would like referral to [facility] due to past issues with reflux and difficulty swallowing

## 2019-04-29 ENCOUNTER — Encounter: Payer: Self-pay | Admitting: Family Medicine

## 2019-05-01 NOTE — Telephone Encounter (Signed)
Informed patient with what provider stated and she verbalized understanding.  

## 2019-05-03 ENCOUNTER — Telehealth: Payer: Self-pay | Admitting: General Practice

## 2019-05-03 NOTE — Telephone Encounter (Signed)
Pt states [facility] did not receive a referral as of today 05/03/2019. Please follow up with pt when possible

## 2019-05-04 NOTE — Telephone Encounter (Signed)
They did received in [place]

## 2019-05-17 ENCOUNTER — Other Ambulatory Visit: Payer: Self-pay

## 2019-05-17 ENCOUNTER — Ambulatory Visit: Payer: Self-pay | Attending: Family Medicine | Admitting: Family Medicine

## 2019-05-17 ENCOUNTER — Encounter: Payer: Self-pay | Admitting: Family Medicine

## 2019-05-17 VITALS — BP 138/84 | HR 79 | Temp 98.6°F | Resp 18 | Ht 64.0 in | Wt 239.0 lb

## 2019-05-17 DIAGNOSIS — K219 Gastro-esophageal reflux disease without esophagitis: Secondary | ICD-10-CM

## 2019-05-17 DIAGNOSIS — K429 Umbilical hernia without obstruction or gangrene: Secondary | ICD-10-CM

## 2019-05-17 DIAGNOSIS — Z09 Encounter for follow-up examination after completed treatment for conditions other than malignant neoplasm: Secondary | ICD-10-CM

## 2019-05-17 DIAGNOSIS — R1013 Epigastric pain: Secondary | ICD-10-CM

## 2019-05-17 MED ORDER — SUCRALFATE 1 GM/10ML PO SUSP: 1.0000 g | Freq: Three times a day (TID) | ORAL | 0 refills | Status: AC

## 2019-05-17 MED ORDER — ESOMEPRAZOLE MAGNESIUM 40 MG PO CPDR: 40.0000 mg | DELAYED_RELEASE_CAPSULE | Freq: Two times a day (BID) | ORAL | 1 refills | Status: DC

## 2019-05-17 MED FILL — CARAFATE 1 GM/10 ML SUSP: 1 | 10 days supply | Qty: 420 | Fill #0

## 2019-05-17 MED FILL — ESOMEPRAZOLE MAG DR 40 MG C: 40 | 30 days supply | Qty: 60 | Fill #0

## 2019-05-17 NOTE — Progress Notes (Signed)
Established Patient Office Visit  Subjective:  Patient ID: Theresa Burke, female    DOB: 16-May-1981  Age: 38 y.o. MRN: [id number]  CC:  Chief Complaint  Patient presents with  . Hernia    HPI Theresa Burke, 38 year old female, who is status post recent emergency department visit on 04/23/2019 secondary to the complaint of burning chest pain.  On review of emergency department note, it was thought the patient's chest pain was secondary to GERD as she had improvement in symptoms with Pepcid and GI cocktail.  EKG was normal.  Referral was placed to GI by the emergency department.  Patient was prescribed sucralfate suspension to take 1 g by mouth 4 times daily.        Patient reports that she continues to have sharp, aching pain in her mid upper abdomen as well as near her bellybutton.  She rates the pain at about a 6 on a 0-to-10 scale with 10 being the worst possible pain imaginable.  She reports that she did not get the medication filled that was prescribed at the emergency department as she states that the pharmacist told her that if she did not have an ulcer then the medication would not be helpful.  Patient states that she was never told at the emergency department that she had an ulcer so she did not fill the medication.  She reports that she has been contacted by gastroenterology for a telemedicine visit on November 30.  She denies any blood in the stool and no dark stools.  She reports that she believes that her symptoms might be related to her umbilical hernia.  She continues to take omeprazole for acid reflux.  She states that her upper mid abdominal pain occurs most often at night.  She also sometimes feels as if she has gurgling in her abdomen.  She has not eaten prior to today's visit.  Past Medical History:  Diagnosis Date  . Anxiety   . Hypertension     Past Surgical History:  Procedure Laterality Date  . NO PAST SURGERIES      Family History  Problem Relation Age of Onset  .  Cancer Mother   . Cervical cancer Mother   . Other Other        doesn't really know - but no h/o CAD, HLD HTN or DM.   Marland Kitchen. Hypertension Sister     Social History   Socioeconomic History  . Marital status: Single    Spouse name: Not on file  . Number of children: Not on file  . Years of education: Not on file  . Highest education level: Not on file  Occupational History  . Not on file  Social Needs  . Financial resource strain: Not on file  . Food insecurity    Worry: Not on file    Inability: Not on file  . Transportation needs    Medical: Not on file    Non-medical: Not on file  Tobacco Use  . Smoking status: Former Games developermoker  . Smokeless tobacco: Never Used  Substance and Sexual Activity  . Alcohol use: Yes    Comment: social  . Drug use: Never  . Sexual activity: Yes    Birth control/protection: Other-see comments  Lifestyle  . Physical activity    Days per week: Not on file    Minutes per session: Not on file  . Stress: Not on file  Relationships  . Social connections    Talks on phone: Not on  file    Gets together: Not on file    Attends religious service: Not on file    Active member of club or organization: Not on file    Attends meetings of clubs or organizations: Not on file    Relationship status: Not on file  . Intimate partner violence    Fear of current or ex partner: Not on file    Emotionally abused: Not on file    Physically abused: Not on file    Forced sexual activity: Not on file  Other Topics Concern  . Not on file  Social History Narrative   ** Merged History Encounter **        Outpatient Medications Prior to Visit  Medication Sig Dispense Refill  . esomeprazole (NEXIUM) 40 MG capsule Take 1 capsule (40 mg total) by mouth daily. 90 capsule 3  . sucralfate (CARAFATE) 1 GM/10ML suspension Take 10 mLs (1 g total) by mouth 4 (four) times daily -  with meals and at bedtime. (Patient not taking: Reported on 05/17/2019) 420 mL 0   No  facility-administered medications prior to visit.     No Known Allergies  ROS Review of Systems  Constitutional: Positive for fatigue. Negative for chills and fever.  HENT: Negative for sore throat and trouble swallowing.   Eyes: Negative for photophobia and visual disturbance.  Respiratory: Negative for cough and shortness of breath.   Cardiovascular: Negative for chest pain and palpitations.  Gastrointestinal: Positive for abdominal pain. Negative for blood in stool, constipation, diarrhea and nausea.  Endocrine: Negative for polydipsia, polyphagia and polyuria.  Genitourinary: Negative for dysuria and frequency.  Musculoskeletal: Negative for arthralgias and back pain.  Neurological: Negative for dizziness and headaches.  Hematological: Negative for adenopathy. Does not bruise/bleed easily.  Psychiatric/Behavioral: Negative for self-injury and suicidal ideas. The patient is nervous/anxious (Anxious about her health).       Objective:    Physical Exam  Constitutional: She is oriented to person, place, and time. She appears well-developed and well-nourished.  Well-nourished well-developed obese female in no acute distress  Neck: Normal range of motion. Neck supple. No JVD present.  Cardiovascular: Normal rate and regular rhythm.  Pulmonary/Chest: Effort normal and breath sounds normal.  Abdominal: Soft. There is abdominal tenderness (Epigastric). There is no rebound and no guarding.  Truncal obesity, patient with hyperactive bowel sounds, patient with epigastric tenderness to palpation  Musculoskeletal:        General: No tenderness (No CVA tenderness) or edema.  Lymphadenopathy:    She has no cervical adenopathy.  Neurological: She is alert and oriented to person, place, and time.  Psychiatric: She has a normal mood and affect. Her behavior is normal.  Nursing note and vitals reviewed.   BP 138/84 (BP Location: Right Arm, Patient Position: Sitting, Cuff Size: Large)    Pulse 79   Temp 98.6 F (37 C) (Oral)   Resp 18   Ht 5\' 4"  (1.626 m)   Wt 239 lb (108.4 kg)   SpO2 99%   BMI 41.02 kg/m  Wt Readings from Last 3 Encounters:  05/17/19 239 lb (108.4 kg)  04/23/19 230 lb (104.3 kg)  04/05/19 240 lb (108.9 kg)     Health Maintenance Due  Topic Date Due  . HIV Screening  11/11/1993  . PAP SMEAR-Modifier  11/12/1999    Patient declined influenza immunization at today's visit  Lab Results  Component Value Date   TSH 2.314 12/01/2018   Lab Results  Component Value  Date   WBC 4.4 04/23/2019   HGB 11.4 (L) 04/23/2019   HCT 36.7 04/23/2019   MCV 95.8 04/23/2019   PLT 247 04/23/2019   Lab Results  Component Value Date   NA 137 04/23/2019   K 3.8 04/23/2019   CO2 24 04/23/2019   GLUCOSE 97 04/23/2019   BUN 11 04/23/2019   CREATININE 0.77 04/23/2019   BILITOT 0.9 04/23/2019   ALKPHOS 107 04/23/2019   AST 13 (L) 04/23/2019   ALT 12 04/23/2019   PROT 7.4 04/23/2019   ALBUMIN 3.6 04/23/2019   CALCIUM 9.1 04/23/2019   ANIONGAP 8 04/23/2019   No results found for: CHOL No results found for: HDL No results found for: LDLCALC No results found for: TRIG No results found for: CHOLHDL No results found for: ERXV4M    Assessment & Plan:  1. Gastroesophageal reflux disease, unspecified whether esophagitis present; 2.  Epigastric pain; 3.  Encounter for examination following treatment at hospital Note from patient's emergency department visit on 04/23/2019 reviewed and discussed with patient at today's visit.  Patient is encouraged to keep follow-up appointment with gastroenterology.  New prescription for Carafate suspension sent to this pharmacy as patient states that she did not have it filled after her recent emergency department visit as she did not think that she had been diagnosed with an ulcer and was told that the medication would not help if she did not have an ulcer per patient's report and patient also thought that the medication was  expensive at her local [facility] pharmacy.  Handout provided on foods to avoid/GERD friendly diet as part of after visit summary-AVS.  Patient's omeprazole will also be increased to 40 mg twice daily as patient states that her epigastric pain is worse at night.  She is to follow-up with gastroenterology. - esomeprazole (NEXIUM) 40 MG capsule; Take 1 capsule (40 mg total) by mouth 2 (two) times daily.  Dispense: 180 capsule; Refill: 1 - sucralfate (CARAFATE) 1 GM/10ML suspension; Take 10 mLs (1 g total) by mouth 4 (four) times daily -  with meals and at bedtime.  Dispense: 420 mL; Refill: 0   An After Visit Summary was printed and given to the patient.  Follow-up: Return in about 8 weeks (around 07/12/2019) for abdominal pain/ GERD-keep follow-up appointment with GI.    Cain Saupe, MD

## 2019-05-17 NOTE — Patient Instructions (Signed)

## 2019-05-23 ENCOUNTER — Ambulatory Visit: Payer: Self-pay | Admitting: Surgery

## 2019-05-30 ENCOUNTER — Other Ambulatory Visit: Payer: Self-pay | Admitting: Gastroenterology

## 2019-05-30 DIAGNOSIS — R1310 Dysphagia, unspecified: Secondary | ICD-10-CM

## 2019-06-06 ENCOUNTER — Ambulatory Visit
Admission: RE | Admit: 2019-06-06 | Discharge: 2019-06-06 | Disposition: A | Discharge status: Home / Self Care | Payer: No Typology Code available for payment source | Source: Ambulatory Visit | Attending: Gastroenterology | Admitting: Gastroenterology

## 2019-06-06 DIAGNOSIS — R1310 Dysphagia, unspecified: Secondary | ICD-10-CM

## 2019-06-13 ENCOUNTER — Other Ambulatory Visit: Payer: Self-pay

## 2019-06-13 ENCOUNTER — Ambulatory Visit (HOSPITAL_COMMUNITY)
Admission: EM | Admit: 2019-06-13 | Discharge: 2019-06-13 | Disposition: A | Discharge status: Home / Self Care | Payer: Self-pay | Attending: Family Medicine | Admitting: Family Medicine

## 2019-06-13 ENCOUNTER — Encounter (HOSPITAL_COMMUNITY): Payer: Self-pay

## 2019-06-13 DIAGNOSIS — Z87891 Personal history of nicotine dependence: Secondary | ICD-10-CM | POA: Insufficient documentation

## 2019-06-13 DIAGNOSIS — R519 Headache, unspecified: Secondary | ICD-10-CM | POA: Insufficient documentation

## 2019-06-13 DIAGNOSIS — I10 Essential (primary) hypertension: Secondary | ICD-10-CM | POA: Insufficient documentation

## 2019-06-13 DIAGNOSIS — Z20828 Contact with and (suspected) exposure to other viral communicable diseases: Secondary | ICD-10-CM | POA: Insufficient documentation

## 2019-06-13 MED ORDER — DICLOFENAC SODIUM 75 MG PO TBEC: 75.0000 mg | DELAYED_RELEASE_TABLET | Freq: Two times a day (BID) | ORAL | 0 refills | Status: DC

## 2019-06-13 NOTE — Discharge Instructions (Signed)
If your Covid-19 test is positive, you will receive a phone call from [facility] regarding your results. Negative test results are not called. Both positive and negative results area always visible on MyChart. If you do not have a MyChart account, sign up instructions are in your discharge papers.  Follow up with your primary care doctor or here within 48-72 hours. Do your best to ensure adequate rest. If not allergic, take acetaminophen (Tylenol) every 4-6 hours as needed for discomfort. Often individuals will develop a headache associated with mild nausea in the days or hours after a head injury. This is called a concussion and may require further follow up.  Please seek prompt medical care if: You have: A very bad (severe) headache that is not helped by medicine. Trouble walking or weakness in your arms and legs. Clear or bloody fluid coming from your nose or ears. Changes in your seeing (vision). Jerky movements that you cannot control (seizure). You throw up (vomit). Your symptoms get worse. You lose balance. Your speech is slurred. You pass out. You are sleepier and have trouble staying awake. The black centers of your eyes (pupils) change in size.  These symptoms may be an emergency. Do not wait to see if the symptoms will go away. Get medical help right away. Call your local emergency services. Do not drive yourself to the hospital.

## 2019-06-13 NOTE — ED Provider Notes (Signed)
[name]   [id number] 06/13/19 Arrival Time: 0901  ASSESSMENT & PLAN:  1. Bad headache      Discussed possibility of COVID. Testing sent. Work note provided.  Normal neurological exam. Afebrile without nuchal rigidity. Discussed. Current presentation and symptoms are not consistent with SAH, ICH, meningitis, or temporal arteritis. No indication for neurodiagnostic workup at this time. Discussed.  Recommend:   Follow-up Information    Antony Blackbird, MD.   Specialty: Family Medicine Why: As needed. Contact information: [place] [id number] [phone]           OTC analgesics and symptom care as needed.  Reviewed expectations re: course of current medical issues. Questions answered. Outlined signs and symptoms indicating need for more acute intervention. Patient verbalized understanding. After Visit Summary given.   SUBJECTIVE: History from: Patient. Patient is able to give a clear and coherent history.  Theresa Burke is a 38 y.o. female who presents with complaint of a headache. Onset gradual, 2 days ago; on/off; does not wake her at night. Location: frontal without radiation. History of headaches: "occasionally but usually don't last this long". Precipitating factors include: none which have been determined. Associated symptoms: Preceding aura: no. Nausea/vomiting: no. Vision changes: no. Increased sensitivity to light and to noises: no. Fever: no. Sinus pressure/congestion: questions mild. Extremity weakness: no. Home treatment has included Tylenol with little improvement. Current headache has not limited normal daily activities. Denies depression, dizziness, loss of balance, muscle weakness, numbness of extremities and speech difficulties. No head injury reported. Ambulatory without difficulty. No recent travel.  ROS: As per HPI. All other systems negative.    OBJECTIVE:  Vitals:   06/13/19 0941 06/13/19 0943  BP:   (!) 132/58  Pulse:  82  Resp:  19  Temp:  98.3 F (36.8 C)  TempSrc:  Oral  SpO2:  100%  Weight: 104.3 kg     General appearance: alert; NAD HENT: normocephalic; atraumatic Eyes: PERRLA; EOMI; conjunctivae normal Neck: supple with FROM Lungs: speaks full sentences without difficulty CV: regular rate and rhythm Extremities: no edema; symmetrical with no gross deformities Skin: warm and dry Neurologic: alert; speech is fluent and clear without dysarthria or aphasia; CN 2-12 grossly intact; no facial droop; normal gait; normal symmetric reflexes; normal extremity strength and sensation throughout; bilateral upper and lower extremity sensation is grossly intact with 5/5 symmetric strength Psychological: alert and cooperative; normal mood and affect  Labs:  Labs Reviewed  NOVEL CORONAVIRUS, NAA (HOSP ORDER, SEND-OUT TO REF LAB; TAT 18-24 HRS)    No Known Allergies  Past Medical History:  Diagnosis Date  . Anxiety   . Hypertension    Social History   Socioeconomic History  . Marital status: Single    Spouse name: Not on file  . Number of children: Not on file  . Years of education: Not on file  . Highest education level: Not on file  Occupational History  . Not on file  Tobacco Use  . Smoking status: Former Research scientist (life sciences)  . Smokeless tobacco: Never Used  Substance and Sexual Activity  . Alcohol use: Yes    Comment: social  . Drug use: Never  . Sexual activity: Yes    Birth control/protection: Other-see comments  Other Topics Concern  . Not on file  Social History Narrative   ** Merged History Encounter **       Social Determinants of Health   Financial Resource Strain:   . Difficulty of Paying Living Expenses:  Not on file  Food Insecurity:   . Worried About Programme researcher, broadcasting/film/video in the Last Year: Not on file  . Ran Out of Food in the Last Year: Not on file  Transportation Needs:   . Lack of Transportation (Medical): Not on file  . Lack of Transportation  (Non-Medical): Not on file  Physical Activity:   . Days of Exercise per Week: Not on file  . Minutes of Exercise per Session: Not on file  Stress:   . Feeling of Stress : Not on file  Social Connections:   . Frequency of Communication with Friends and Family: Not on file  . Frequency of Social Gatherings with Friends and Family: Not on file  . Attends Religious Services: Not on file  . Active Member of Clubs or Organizations: Not on file  . Attends Banker Meetings: Not on file  . Marital Status: Not on file  Intimate Partner Violence:   . Fear of Current or Ex-Partner: Not on file  . Emotionally Abused: Not on file  . Physically Abused: Not on file  . Sexually Abused: Not on file   Family History  Problem Relation Age of Onset  . Cancer Mother   . Cervical cancer Mother   . Healthy Father   . Other Other        doesn't really know - but no h/o CAD, HLD HTN or DM.   Marland Kitchen Hypertension Sister    Past Surgical History:  Procedure Laterality Date  . NO PAST SURGERIES       Mardella Layman, MD 06/13/19 1040

## 2019-06-13 NOTE — ED Triage Notes (Signed)
Pt. Presents a severe headache for 2 days now, she has self medicated since.

## 2019-06-15 LAB — NOVEL CORONAVIRUS, NAA (HOSP ORDER, SEND-OUT TO REF LAB; TAT 18-24 HRS): SARS-CoV-2, NAA: NOT DETECTED

## 2019-06-19 ENCOUNTER — Telehealth: Payer: Self-pay | Admitting: Family Medicine

## 2019-06-19 NOTE — Telephone Encounter (Signed)
Lvm to patient  Regarding her Surgery referral    Patient is uninsured  She has [facility]) [facility] is the only affiliated in the program  . I don't sent someone out of [place] because I want to is because we don't have  Many resources  For uninsured patients

## 2019-07-12 ENCOUNTER — Other Ambulatory Visit: Payer: Self-pay

## 2019-07-12 ENCOUNTER — Other Ambulatory Visit: Payer: Self-pay | Admitting: Gastroenterology

## 2019-07-12 ENCOUNTER — Ambulatory Visit: Payer: Self-pay | Attending: Family Medicine | Admitting: Family Medicine

## 2019-07-12 ENCOUNTER — Encounter: Payer: Self-pay | Admitting: Family Medicine

## 2019-07-12 DIAGNOSIS — D649 Anemia, unspecified: Secondary | ICD-10-CM

## 2019-07-12 DIAGNOSIS — R1310 Dysphagia, unspecified: Secondary | ICD-10-CM

## 2019-07-12 DIAGNOSIS — R519 Headache, unspecified: Secondary | ICD-10-CM

## 2019-07-12 DIAGNOSIS — G8929 Other chronic pain: Secondary | ICD-10-CM

## 2019-07-12 DIAGNOSIS — R1013 Epigastric pain: Secondary | ICD-10-CM

## 2019-07-12 DIAGNOSIS — Z09 Encounter for follow-up examination after completed treatment for conditions other than malignant neoplasm: Secondary | ICD-10-CM

## 2019-07-12 NOTE — Progress Notes (Signed)
Virtual Visit via Telephone Note  I connected with Theresa Burke on 07/12/19 at  1:30 PM EST by telephone and verified that I am speaking with the correct person using two identifiers.   I discussed the limitations, risks, security and privacy concerns of performing an evaluation and management service by telephone and the availability of in person appointments. I also discussed with the patient that there may be a patient responsible charge related to this service. The patient expressed understanding and agreed to proceed.  Patient Location: Home Provider Location: [facility] Others participating in call: none   History of Present Illness:        39 yo female with the complaint of epigastric pain that is constant and ranges from about a 6 to an 8 on a 0-to-10 scale.  She has not really found anything that makes the pain better or worse.  She has not noticed any improvement or increased pain after eating.  She does have a decreased appetite as well as fatigue.  She reports that she is sure that something is seriously wrong with her.  She was slightly tearful during conversation.  She reports that she did see GI and was sent to have a test at the hospital which she has completed.  She continues to however have sharp, burning pain.  She is taking the twice daily omeprazole as well as sucralfate.  She also reports continued difficulty with swallowing.  She denies any cough or choking sensation with liquids but states that she feels as if she cannot swallow solids, she feels as if they get stuck in her throat/chest.  She reports that she has tried to schedule follow-up with gastroenterology but is unable to get a follow-up appointment as she was told by the office that they have been very busy.            She is also status post recent emergency department visit due to headaches and had a negative Covid test.  She continues to intermittently have headaches once or twice per week or sometimes once every 2  weeks.  Headaches tend to occur at the temples/eye area and are accompanied by a sensation that lights are too bright and noise is too loud.  She also has occasional nausea along with the headaches.  She has tried Tylenol for the headaches and this does decrease the headache pain.  She denies any chest pain or palpitations, no shortness of breath or cough and no sore throat.  No current dizziness and no episodes of focal numbness or weakness.   Past Medical History:  Diagnosis Date  . Anxiety   . Hypertension     Past Surgical History:  Procedure Laterality Date  . NO PAST SURGERIES      Family History  Problem Relation Age of Onset  . Cancer Mother   . Cervical cancer Mother   . Healthy Father   . Other Other        doesn't really know - but no h/o CAD, HLD HTN or DM.   Marland Kitchen Hypertension Sister     Social History   Tobacco Use  . Smoking status: Former Games developer  . Smokeless tobacco: Never Used  Substance Use Topics  . Alcohol use: Yes    Comment: social  . Drug use: Never     No Known Allergies     Observations/Objective: No vital signs or physical exam conducted as visit was done via telephone  Assessment and Plan: 1. Epigastric pain;  2. Dysphagia She reports continued burning epigastric pain despite omeprazole twice daily and use of sucralfate.  She did see gastroenterology however the office notes were not available as the gastroenterology office does not use Epic.  On review of chart, she did have normal esophagram/barium swallow on 06/06/2019 ordered by gastroenterology but she states that she has been unable to make a follow-up appointment.  New referral placed to gastroenterology in follow-up of patient's epigastric pain and difficulty with swallowing solid foods.  She will have lab visit tomorrow for H. pylori antibody, IgG, CBC and lipase in follow-up of her abdominal pain - Ambulatory referral to Gastroenterology - H. pylori antibody, IgG; Future - CBC; Future -  Lipase; Future  3. Normocytic anemia Patient requested review of most recent blood work in her chart.  Patient had blood work done on 04/23/2019 at emergency department visit and had normal lipase, normal LFTs, normal BMP but did have mild anemia with hemoglobin of 11.4.  She would like to have this repeated and will come into the office tomorrow to have repeat CBC. - CBC; Future  4. Chronic nonintractable headache, unspecified headache type 5. Encounter for examination following treatment at hospital She is status post emergency department visit on 06/13/2019 due to complaint of a bad headache.  She reports that she continues to have intermittent headaches associated with light and noise sensitivity and nausea.  Discussed with patient that it sounds as if she has migraine type headaches.  Her headaches are improved with the use of Tylenol and she was encouraged to take Tylenol or over-the-counter Excedrin Migraine as needed for headaches.  Follow Up Instructions:Return in about 6 weeks (around 08/23/2019) for abd pain-f/u with GI; lab visit tomorrow.    I discussed the assessment and treatment plan with the patient. The patient was provided an opportunity to ask questions and all were answered. The patient agreed with the plan and demonstrated an understanding of the instructions.   The patient was advised to call back or seek an in-person evaluation if the symptoms worsen or if the condition fails to improve as anticipated.  I provided  12 minutes of non-face-to-face time during this encounter.   Antony Blackbird, MD

## 2019-07-13 ENCOUNTER — Ambulatory Visit: Payer: Self-pay | Attending: Family Medicine

## 2019-07-13 ENCOUNTER — Other Ambulatory Visit: Payer: Self-pay

## 2019-07-13 DIAGNOSIS — D649 Anemia, unspecified: Secondary | ICD-10-CM

## 2019-07-13 DIAGNOSIS — R1013 Epigastric pain: Secondary | ICD-10-CM

## 2019-07-14 LAB — H. PYLORI ANTIBODY, IGG: H. pylori, IgG AbS: 0.35 {index_val} (ref 0.00–0.79)

## 2019-07-14 LAB — CBC
Hematocrit: 37 % (ref 34.0–46.6)
Hemoglobin: 12 g/dL (ref 11.1–15.9)
MCH: 29.7 pg (ref 26.6–33.0)
MCHC: 32.4 g/dL (ref 31.5–35.7)
MCV: 92 fL (ref 79–97)
Platelets: 308 x10E3/uL (ref 150–450)
RBC: 4.04 x10E6/uL (ref 3.77–5.28)
RDW: 12.1 % (ref 11.7–15.4)
WBC: 4.4 x10E3/uL (ref 3.4–10.8)

## 2019-07-14 LAB — LIPASE: Lipase: 28 U/L (ref 14–72)

## 2019-07-15 ENCOUNTER — Other Ambulatory Visit: Payer: Self-pay

## 2019-07-15 ENCOUNTER — Ambulatory Visit (HOSPITAL_COMMUNITY)
Admission: EM | Admit: 2019-07-15 | Discharge: 2019-07-15 | Disposition: A | Discharge status: Home / Self Care | Payer: No Typology Code available for payment source | Attending: Physician Assistant | Admitting: Physician Assistant

## 2019-07-15 ENCOUNTER — Encounter (HOSPITAL_COMMUNITY): Payer: Self-pay

## 2019-07-15 DIAGNOSIS — R1013 Epigastric pain: Secondary | ICD-10-CM

## 2019-07-15 DIAGNOSIS — K219 Gastro-esophageal reflux disease without esophagitis: Secondary | ICD-10-CM

## 2019-07-15 HISTORY — DX: Gastro-esophageal reflux disease without esophagitis: K21.9

## 2019-07-15 MED ORDER — FAMOTIDINE 20 MG PO TABS: 20.0000 mg | ORAL_TABLET | Freq: Two times a day (BID) | ORAL | 0 refills | Status: DC

## 2019-07-15 NOTE — ED Provider Notes (Signed)
[facility]    CSN: [id number] Arrival date & time: 07/15/19  1437      History   Chief Complaint Chief Complaint  Patient presents with  . Abdominal Pain    HPI Theresa Burke is a 39 y.o. female.   Patient reports to urgent care today for 1 week of having blood in her sputum that she believes is coming from her stomach. She states it is streaked in the sputum and has been very small amounts. She also believes she has had some blood in her stool. No black tarry stool however. She endorses epigastric pain today and has a long history of GERD. She is endorsing some mild nausea but no vomit. No diarrhea. She is currently taking carafate and nexium which was prescribed by her Primary Care on 07/11/2018. Food does seem to make it worse both immediately after and after some time has passed. She was referred to GI and has scheduled upper endoscopy on 07/20/2019. She does not state things are getting worse but she has not had much relief. She is very worried about what the results of the endoscopy may find.   She denies fever, chills, chest pain or shortness of breath.         Past Medical History:  Diagnosis Date  . Anxiety   . GERD (gastroesophageal reflux disease)   . Hypertension     Patient Active Problem List   Diagnosis Date Noted  . Chest wall pain 12/01/2018    Past Surgical History:  Procedure Laterality Date  . NO PAST SURGERIES      OB History   No obstetric history on file.      Home Medications    Prior to Admission medications   Medication Sig Start Date End Date Taking? Authorizing Provider  diclofenac (VOLTAREN) 75 MG EC tablet Take 1 tablet (75 mg total) by mouth 2 (two) times daily. 06/13/19   Mardella Layman, MD  esomeprazole (NEXIUM) 40 MG capsule Take 1 capsule (40 mg total) by mouth 2 (two) times daily. 05/17/19   Fulp, Cammie, MD  famotidine (PEPCID) 20 MG tablet Take 1 tablet (20 mg total) by mouth 2 (two) times daily. 07/15/19   Yedidya Duddy,  Veryl Speak, PA-C  sucralfate (CARAFATE) 1 GM/10ML suspension Take 10 mLs (1 g total) by mouth 4 (four) times daily -  with meals and at bedtime. 05/17/19   Cain Saupe, MD    Family History Family History  Problem Relation Age of Onset  . Cancer Mother   . Cervical cancer Mother   . Healthy Father   . Other Other        doesn't really know - but no h/o CAD, HLD HTN or DM.   Marland Kitchen Hypertension Sister     Social History Social History   Tobacco Use  . Smoking status: Former Games developer  . Smokeless tobacco: Never Used  Substance Use Topics  . Alcohol use: Yes    Comment: social  . Drug use: Never     Allergies   Patient has no known allergies.   Review of Systems Review of Systems  Constitutional: Negative for chills and fever.  Respiratory: Negative for cough and shortness of breath.   Cardiovascular: Negative for chest pain.  Gastrointestinal: Positive for abdominal pain, blood in stool and nausea. Negative for abdominal distention, diarrhea and vomiting.  Genitourinary: Negative for dysuria.  Musculoskeletal: Negative for arthralgias, back pain and myalgias.  Skin: Negative for rash.  Neurological: Negative for dizziness,  light-headedness and headaches.     Physical Exam Triage Vital Signs ED Triage Vitals [07/15/19 1616]  Enc Vitals Group     BP 119/69     Pulse Rate 86     Resp 18     Temp 97.9 F (36.6 C)     Temp src      SpO2 100 %     Weight      Height      Head Circumference      Peak Flow      Pain Score      Pain Loc      Pain Edu?      Excl. in GC?    No data found.  Updated Vital Signs BP 119/69 (BP Location: Right Arm)   Pulse 86   Temp 97.9 F (36.6 C)   Resp 18   LMP 07/06/2019   SpO2 100%   Visual Acuity Right Eye Distance:   Left Eye Distance:   Bilateral Distance:    Right Eye Near:   Left Eye Near:    Bilateral Near:     Physical Exam Vitals and nursing note reviewed.  Constitutional:      General: She is in acute  distress (emotional ).     Appearance: She is well-developed. She is not ill-appearing or diaphoretic.  HENT:     Head: Normocephalic and atraumatic.  Eyes:     General: No scleral icterus.    Extraocular Movements: Extraocular movements intact.     Conjunctiva/sclera: Conjunctivae normal.     Pupils: Pupils are equal, round, and reactive to light.  Cardiovascular:     Rate and Rhythm: Normal rate and regular rhythm.     Heart sounds: No murmur. No gallop.   Pulmonary:     Effort: Pulmonary effort is normal. No respiratory distress.     Breath sounds: Normal breath sounds. No wheezing, rhonchi or rales.  Abdominal:     General: Abdomen is flat. Bowel sounds are normal. There is no distension.     Palpations: Abdomen is soft. There is no mass.     Tenderness: There is abdominal tenderness in the right lower quadrant, epigastric area, suprapubic area and left lower quadrant. There is no right CVA tenderness or left CVA tenderness.     Hernia: No hernia is present.  Musculoskeletal:     Cervical back: Neck supple.  Skin:    General: Skin is warm and dry.     Capillary Refill: Capillary refill takes less than 2 seconds.     Coloration: Skin is not cyanotic, jaundiced or pale.     Findings: No rash.  Neurological:     General: No focal deficit present.     Mental Status: She is alert and oriented to person, place, and time.  Psychiatric:        Mood and Affect: Mood is anxious. Mood is not depressed.        Behavior: Behavior normal.      UC Treatments / Results  Labs (all labs ordered are listed, but only abnormal results are displayed) Labs Reviewed - No data to display   Labs from [facility] in chart reviewed, WNL  EKG   Radiology No results found.  Procedures Procedures (including critical care time)  Medications Ordered in UC Medications - No data to display  Initial Impression / Assessment and Plan / UC Course  I have reviewed the triage vital signs and the  nursing notes.  Pertinent labs &  imaging results that were available during my care of the patient were reviewed by me and considered in my medical decision making (see chart for details).     #GERD #Epigastric Pain - Known history of GERD. Suspicious for ulcer at this point with complaints of blood in sputum. Had labs drawn at with [facility] on 07/13/2019, lipase 28, CBC normal with some mild improvement in Hgb from October 11.4--12.0. She has close follow up with EGD scheduled on 07/20/2019. No acute concern for action now. Giving famotidine for h2 blockage, continue carafate and nexium. Strict ED precautions for worsening bleeding or black tarry stools.    Final Clinical Impressions(s) / UC Diagnoses   Final diagnoses:  Abdominal pain, epigastric  Gastroesophageal reflux disease without esophagitis     Discharge Instructions     Begin taking the pepcid 2 times a day  If you begin to spit up more blood, or begin to vomit blood, have more blood in your stool, have a fever, please be evaluated at the Emergency Department  Continue you nexium and carafate as prescribed and attend your procedure on the 21st.       ED Prescriptions    Medication Sig Dispense Auth. Provider   famotidine (PEPCID) 20 MG tablet Take 1 tablet (20 mg total) by mouth 2 (two) times daily. 30 tablet Naiomi Musto, Marguerita Beards, PA-C     PDMP not reviewed this encounter.   Purnell Shoemaker, PA-C 07/16/19 1007

## 2019-07-15 NOTE — Discharge Instructions (Signed)
Begin taking the pepcid 2 times a day  If you begin to spit up more blood, or begin to vomit blood, have more blood in your stool, have a fever, please be evaluated at the Emergency Department  Continue you nexium and carafate as prescribed and attend your procedure on the 21st.

## 2019-07-15 NOTE — ED Triage Notes (Signed)
Pt states she has been spitting up blood. And she has abdominal pain. X 1 week or more. Pt states she has been told that she has Theresa Burke.

## 2019-07-17 ENCOUNTER — Telehealth: Payer: Self-pay | Admitting: *Deleted

## 2019-07-17 ENCOUNTER — Other Ambulatory Visit (HOSPITAL_COMMUNITY)
Admission: RE | Admit: 2019-07-17 | Discharge: 2019-07-17 | Disposition: A | Discharge status: Home / Self Care | Payer: HRSA Program | Source: Ambulatory Visit | Attending: Gastroenterology | Admitting: Gastroenterology

## 2019-07-17 DIAGNOSIS — Z01812 Encounter for preprocedural laboratory examination: Secondary | ICD-10-CM | POA: Insufficient documentation

## 2019-07-17 DIAGNOSIS — Z20822 Contact with and (suspected) exposure to covid-19: Secondary | ICD-10-CM | POA: Insufficient documentation

## 2019-07-17 NOTE — Progress Notes (Signed)
Pre-op endo call attempted. No answer. Lmtcb.  Pre-op endo call completed.

## 2019-07-17 NOTE — Telephone Encounter (Signed)
-----   Message from Cammie Fulp, MD sent at 07/14/2019 11:23 PM EST ----- Normal lipase (pancreatic enzyme).  Normal complete blood count.  Negative test for bacteria that can cause stomach ulcers. 

## 2019-07-17 NOTE — Telephone Encounter (Signed)
Medical Assistant left message on patient's home and cell voicemail. Voicemail states to give a call back to [place] with [facility] at [phone]. !Patient labs are normal, no bacteria to cause ulcers!

## 2019-07-17 NOTE — Telephone Encounter (Signed)
Patient verified DOB Patient is aware of pancreas and CBC being normal and no bacteria being noted for ulcers. Patient is aware of keeping surgery appointment on 1/21. Patient complains of pain in the abdominal area being present while on the phone.

## 2019-07-17 NOTE — Telephone Encounter (Signed)
-----   Message from Cain Saupe, MD sent at 07/14/2019 11:23 PM EST ----- Normal lipase (pancreatic enzyme).  Normal complete blood count.  Negative test for bacteria that can cause stomach ulcers.

## 2019-07-18 LAB — NOVEL CORONAVIRUS, NAA (HOSP ORDER, SEND-OUT TO REF LAB; TAT 18-24 HRS): SARS-CoV-2, NAA: NOT DETECTED

## 2019-07-19 ENCOUNTER — Telehealth: Payer: Self-pay | Admitting: Family Medicine

## 2019-07-19 NOTE — Telephone Encounter (Signed)
Patient stated that her referral for tomorrow was cancelled and she needed it sent somewhere that it would not get canceled. Please fu at your earliest convenience.

## 2019-07-20 ENCOUNTER — Encounter (HOSPITAL_COMMUNITY): Admission: RE | Payer: Self-pay | Source: Home / Self Care

## 2019-07-20 ENCOUNTER — Ambulatory Visit (HOSPITAL_COMMUNITY)
Admission: RE | Admit: 2019-07-20 | Payer: No Typology Code available for payment source | Source: Home / Self Care | Admitting: Gastroenterology

## 2019-07-20 SURGERY — ESOPHAGOGASTRODUODENOSCOPY (EGD) WITH PROPOFOL
Anesthesia: Monitor Anesthesia Care

## 2019-07-20 NOTE — Telephone Encounter (Signed)
Patient returned call. Please f/u with pt. °

## 2019-07-20 NOTE — Telephone Encounter (Signed)
Good Afternoon  I spoke to patient she was upset and crying because [facility]  canceled her Endoscopy procedure for today  due to  Covid restrictions  (they are canceling minor surgeries)and they giver a number to call I called for her it was the [facility] . She is in a severe pain that she decide to go to the Emergency room

## 2019-07-31 ENCOUNTER — Telehealth: Payer: Self-pay

## 2019-07-31 MED FILL — ESOMEPRAZOLE MAG DR 40 MG C: 40 | 30 days supply | Qty: 60 | Fill #1

## 2019-07-31 NOTE — Telephone Encounter (Signed)
Received call from patient stated Endoscopy procedure was canceled. Pt is upset and ask alternative treatment for her epigastric pain.   Stated she was not able to take her prescribed omeprazole meds and due to monetary situation, but today she has the money and will come to the pharmacy to pick up her meds. Stressed the importance meds compliance. Verbalized understanding    Please advice!

## 2019-07-31 NOTE — Telephone Encounter (Signed)
She needs to also call GI about rescheduling her endoscopy and what other treatments they suggest for her epigastric pain but her pain will also likely also improve once she is able to restart the use of omeprazole

## 2019-08-01 ENCOUNTER — Telehealth: Payer: Self-pay | Admitting: Family Medicine

## 2019-08-01 NOTE — Telephone Encounter (Signed)
Spoke with patient Md instructions given. Verbalized understanding

## 2019-08-01 NOTE — Telephone Encounter (Signed)
Pt called, she said she need to speak with the nurse since they been cancelling her specialist appt and she need more information and help, please follow up

## 2019-08-04 ENCOUNTER — Encounter: Payer: Self-pay | Admitting: *Deleted

## 2019-08-04 NOTE — Telephone Encounter (Signed)
She may want to go to the ED if she is having severe pain. You may want to try and call GI for her or ask Theresa Burke to resend GI referral

## 2019-08-04 NOTE — Telephone Encounter (Signed)
Returned call to pt / stated making several attempts to GI office no success/ Nobody has returned her calls. Restarted the omeprazole and still feels  the same. Please advice!

## 2019-08-07 NOTE — Telephone Encounter (Signed)
Spoke with patient MD instructions given / Made Arna Medici aware to possible resend referral

## 2019-08-22 ENCOUNTER — Encounter: Payer: Self-pay | Admitting: Family Medicine

## 2019-08-22 ENCOUNTER — Other Ambulatory Visit (HOSPITAL_COMMUNITY)
Admission: RE | Admit: 2019-08-22 | Discharge: 2019-08-22 | Disposition: A | Discharge status: Home / Self Care | Payer: HRSA Program | Source: Ambulatory Visit | Attending: Gastroenterology | Admitting: Gastroenterology

## 2019-08-22 DIAGNOSIS — Z20822 Contact with and (suspected) exposure to covid-19: Secondary | ICD-10-CM | POA: Diagnosis not present

## 2019-08-22 DIAGNOSIS — Z01812 Encounter for preprocedural laboratory examination: Secondary | ICD-10-CM | POA: Diagnosis present

## 2019-08-22 LAB — SARS CORONAVIRUS 2 (TAT 6-24 HRS): SARS Coronavirus 2: NEGATIVE

## 2019-08-25 ENCOUNTER — Encounter (HOSPITAL_COMMUNITY)
Admission: RE | Disposition: A | Discharge status: Home / Self Care | Payer: Self-pay | Source: Home / Self Care | Attending: Gastroenterology

## 2019-08-25 ENCOUNTER — Other Ambulatory Visit: Payer: Self-pay

## 2019-08-25 ENCOUNTER — Encounter (HOSPITAL_COMMUNITY): Payer: Self-pay | Admitting: Gastroenterology

## 2019-08-25 ENCOUNTER — Ambulatory Visit (HOSPITAL_COMMUNITY): Payer: No Typology Code available for payment source | Admitting: Anesthesiology

## 2019-08-25 ENCOUNTER — Ambulatory Visit (HOSPITAL_COMMUNITY)
Admission: RE | Admit: 2019-08-25 | Discharge: 2019-08-25 | Disposition: A | Discharge status: Home / Self Care | Payer: No Typology Code available for payment source | Attending: Gastroenterology | Admitting: Gastroenterology

## 2019-08-25 DIAGNOSIS — R1310 Dysphagia, unspecified: Secondary | ICD-10-CM | POA: Insufficient documentation

## 2019-08-25 DIAGNOSIS — Z6836 Body mass index (BMI) 36.0-36.9, adult: Secondary | ICD-10-CM | POA: Insufficient documentation

## 2019-08-25 DIAGNOSIS — Z87891 Personal history of nicotine dependence: Secondary | ICD-10-CM | POA: Insufficient documentation

## 2019-08-25 DIAGNOSIS — E669 Obesity, unspecified: Secondary | ICD-10-CM | POA: Insufficient documentation

## 2019-08-25 DIAGNOSIS — F419 Anxiety disorder, unspecified: Secondary | ICD-10-CM | POA: Insufficient documentation

## 2019-08-25 DIAGNOSIS — K2210 Ulcer of esophagus without bleeding: Secondary | ICD-10-CM | POA: Insufficient documentation

## 2019-08-25 DIAGNOSIS — K219 Gastro-esophageal reflux disease without esophagitis: Secondary | ICD-10-CM | POA: Insufficient documentation

## 2019-08-25 DIAGNOSIS — I10 Essential (primary) hypertension: Secondary | ICD-10-CM | POA: Insufficient documentation

## 2019-08-25 HISTORY — PX: BIOPSY: SHX5522

## 2019-08-25 HISTORY — PX: ESOPHAGOGASTRODUODENOSCOPY (EGD) WITH PROPOFOL: SHX5813

## 2019-08-25 SURGERY — ESOPHAGOGASTRODUODENOSCOPY (EGD) WITH PROPOFOL
Anesthesia: Monitor Anesthesia Care

## 2019-08-25 MED ORDER — PROPOFOL 500 MG/50ML IV EMUL
INTRAVENOUS | Status: DC | PRN
  Administered 2019-08-25 (×2): 30 mg via INTRAVENOUS

## 2019-08-25 MED ORDER — PROPOFOL 500 MG/50ML IV EMUL
INTRAVENOUS | Status: DC | PRN
  Administered 2019-08-25: 175 ug/kg/min via INTRAVENOUS

## 2019-08-25 MED ORDER — LACTATED RINGERS IV SOLN
INTRAVENOUS | Status: DC
  Administered 2019-08-25: 1000 mL via INTRAVENOUS

## 2019-08-25 MED ORDER — SODIUM CHLORIDE 0.9 % IV SOLN: INTRAVENOUS | Status: DC

## 2019-08-25 SURGICAL SUPPLY — 14 items

## 2019-08-25 NOTE — Anesthesia Preprocedure Evaluation (Addendum)
Anesthesia Evaluation  Patient identified by MRN, date of birth, ID band Patient awake    Reviewed: Allergy & Precautions, Patient's Chart, lab work & pertinent test results  History of Anesthesia Complications Negative for: history of anesthetic complications  Airway Mallampati: I       Dental no notable dental hx. (+) Teeth Intact   Pulmonary former smoker,    Pulmonary exam normal breath sounds clear to auscultation       Cardiovascular hypertension, Normal cardiovascular exam Rhythm:Regular Rate:Normal     Neuro/Psych Anxiety negative neurological ROS     GI/Hepatic Neg liver ROS, GERD  Medicated,Dysphagia   Endo/Other  negative endocrine ROS  Renal/GU negative Renal ROS  negative genitourinary   Musculoskeletal negative musculoskeletal ROS (+)   Abdominal (+) + obese,   Peds  Hematology negative hematology ROS (+)   Anesthesia Other Findings Day of surgery medications reviewed with patient.  Reproductive/Obstetrics negative OB ROS                            Anesthesia Physical Anesthesia Plan  ASA: II  Anesthesia Plan: MAC   Post-op Pain Management:    Induction:   PONV Risk Score and Plan: Treatment may vary due to age or medical condition and Propofol infusion  Airway Management Planned: Natural Airway, Nasal Cannula and Mask  Additional Equipment: None  Intra-op Plan:   Post-operative Plan:   Informed Consent: I have reviewed the patients History and Physical, chart, labs and discussed the procedure including the risks, benefits and alternatives for the proposed anesthesia with the patient or authorized representative who has indicated his/her understanding and acceptance.     Dental advisory given  Plan Discussed with: CRNA  Anesthesia Plan Comments:        Anesthesia Quick Evaluation

## 2019-08-25 NOTE — Discharge Instructions (Signed)

## 2019-08-25 NOTE — Transfer of Care (Signed)
Immediate Anesthesia Transfer of Care Note  Patient: Theresa Burke  Procedure(s) Performed: ESOPHAGOGASTRODUODENOSCOPY (EGD) WITH PROPOFOL (N/A ) BIOPSY  Patient Location: Endoscopy Unit  Anesthesia Type:MAC  Level of Consciousness: awake, oriented and patient cooperative  Airway & Oxygen Therapy: Patient Spontanous Breathing and Patient connected to face mask oxygen  Post-op Assessment: Report given to RN and Post -op Vital signs reviewed and stable  Post vital signs: Reviewed and stable  Last Vitals:  Vitals Value Taken Time  BP 109/87 08/25/19 1140  Temp 36.5 C 08/25/19 1140  Pulse 91 08/25/19 1141  Resp 18 08/25/19 1142  SpO2 100 % 08/25/19 1141  Vitals shown include unvalidated device data.  Last Pain:  Vitals:   08/25/19 1140  TempSrc: Temporal  PainSc: 0-No pain      Patients Stated Pain Goal: 0 (47/09/62 8366)  Complications: No apparent anesthesia complications

## 2019-08-25 NOTE — H&P (Signed)
The patient has a 39 year old female who has been experiencing solid food dysphagia.  She feels as though meat is the worst offender and will stick in her esophagus when she swallows.  She had a barium swallow done which did not reveal a stricture however when I looked at the films I did see some narrowing distally.  She does not have problems with liquids.  Past history significant for GERD, anxiety,  No prior surgeries  Family history unremarkable  Allergies none known  Physical:  No acute distress  Heart regular rhythm no murmurs  Lungs clear  Abdomen: Bowel sounds present, soft, nontender  Impression: Dysphagia  Plan: EGD with possible dilatation

## 2019-08-25 NOTE — Op Note (Signed)
[facility] [place] [facility] Patient Name: Theresa Burke Procedure Date: 08/25/2019 MRN: [id number] Attending MD: Graylin Shiver , MD Date of Birth: [birth year] CSN: [id number] Age: 39 Admit Type: Inpatient Procedure:                Upper GI endoscopy Indications:              Dysphagia Providers:                Graylin Shiver, MD, Dwain Sarna, RN, Kandice Robinsons, Technician, Mirian Mo, CRNA Referring MD:              Medicines:                Propofol per Anesthesia Complications:            No immediate complications. Estimated Blood Loss:     Estimated blood loss was minimal. Procedure:                Pre-Anesthesia Assessment:                           - Prior to the procedure, a History and Physical                            was performed, and patient medications and                            allergies were reviewed. The patient's tolerance of                            previous anesthesia was also reviewed. The risks                            and benefits of the procedure and the sedation                            options and risks were discussed with the patient.                            All questions were answered, and informed consent                            was obtained. Prior Anticoagulants: The patient has                            taken no previous anticoagulant or antiplatelet                            agents. ASA Grade Assessment: II - A patient with                            mild systemic disease. After reviewing the risks  and benefits, the patient was deemed in                            satisfactory condition to undergo the procedure.                           After obtaining informed consent, the endoscope was                            passed under direct vision. Throughout the                            procedure, the patient's blood pressure, pulse, and                            oxygen  saturations were monitored continuously. The                            GIF-H190 ([id number]) Olympus gastroscope was                            introduced through the mouth, and advanced to the                            second part of duodenum. The upper GI endoscopy was                            accomplished without difficulty. The patient                            tolerated the procedure well. Scope In: Scope Out: Findings:      A single 4 mm erosion was found in the middle third of the esophagus.       Biopsies were taken with a cold forceps for histology.      There is no endoscopic evidence of stenosis in the upper third of the       esophagus, in the middle third of the esophagus, in the lower third of       the esophagus and at the gastroesophageal junction. Nothing to dilate.      The entire examined stomach was normal.      The examined duodenum was normal. Impression:               - A single erosion in the middle third of the                            esophagus. Biopsied.                           - Normal stomach.                           - Normal examined duodenum. Moderate Sedation:      . Recommendation:           - Resume previous diet.                           -  Continue present medications.                           - Await pathology results.                           - Return to my office in 3 weeks. Procedure Code(s):        --- Professional ---                           [phone], Esophagogastroduodenoscopy, flexible,                            transoral; with biopsy, single or multiple Diagnosis Code(s):        --- Professional ---                           K22.10, Ulcer of esophagus without bleeding                           R13.10, Dysphagia, unspecified CPT copyright 2019 American Medical Association. All rights reserved. The codes documented in this report are preliminary and upon coder review may  be revised to meet current compliance requirements. Wonda Horner, MD 08/25/2019 11:40:48 AM This report has been signed electronically. Number of Addenda: 0

## 2019-08-25 NOTE — Anesthesia Postprocedure Evaluation (Signed)
Anesthesia Post Note  Patient: Theresa Burke  Procedure(s) Performed: ESOPHAGOGASTRODUODENOSCOPY (EGD) WITH PROPOFOL (N/A ) BIOPSY     Patient location during evaluation: Endoscopy Anesthesia Type: MAC Level of consciousness: awake Pain management: pain level controlled Vital Signs Assessment: post-procedure vital signs reviewed and stable Respiratory status: spontaneous breathing Cardiovascular status: stable Postop Assessment: no apparent nausea or vomiting Anesthetic complications: no    Last Vitals:  Vitals:   08/25/19 1140 08/25/19 1150  BP: 109/87 117/79  Pulse: 91 85  Resp: (!) 23 18  Temp: 36.5 C   SpO2: 98% 98%    Last Pain:  Vitals:   08/25/19 1150  TempSrc:   PainSc: 0-No pain   Pain Goal: Patients Stated Pain Goal: 0 (08/25/19 1000)                 Huston Foley

## 2019-08-27 ENCOUNTER — Encounter: Payer: Self-pay | Admitting: *Deleted

## 2019-08-28 LAB — SURGICAL PATHOLOGY

## 2019-09-04 ENCOUNTER — Other Ambulatory Visit: Payer: Self-pay

## 2019-09-04 ENCOUNTER — Ambulatory Visit (INDEPENDENT_AMBULATORY_CARE_PROVIDER_SITE_OTHER): Payer: Self-pay | Admitting: Surgery

## 2019-09-04 ENCOUNTER — Encounter: Payer: Self-pay | Admitting: Surgery

## 2019-09-04 VITALS — BP 115/80 | HR 73 | Temp 97.7°F | Ht 64.5 in | Wt 236.4 lb

## 2019-09-04 DIAGNOSIS — K439 Ventral hernia without obstruction or gangrene: Secondary | ICD-10-CM

## 2019-09-04 NOTE — Patient Instructions (Addendum)
Our surgery scheduler Marzetta Board will contact you within the next 25-48 hours. When Stacy contacts you, she will discuss the preparation prior to the surgery and the different dates and times. Please have the BLUE sheet available when she contacts you to discuss the surgery. If you have any questions or concerns, please contact our office.   Dr.Pabon advised patient to increase activity such as walking to help with the recovery stage of surgery. Patient was informed there will be a 4-6 week period of refraining from lifting anything heavier than 20 pounds or more.   Umbilical Hernia, Adult  A hernia is a bulge of tissue that pushes through an opening between muscles. An umbilical hernia happens in the abdomen, near the belly button (umbilicus). The hernia may contain tissues from the small intestine, large intestine, or fatty tissue covering the intestines (omentum). Umbilical hernias in adults tend to get worse over time, and they require surgical treatment. There are several types of umbilical hernias. You may have:  A hernia located just above or below the umbilicus (indirect hernia). This is the most common type of umbilical hernia in adults.  A hernia that forms through an opening formed by the umbilicus (direct hernia).  A hernia that comes and goes (reducible hernia). A reducible hernia may be visible only when you strain, lift something heavy, or cough. This type of hernia can be pushed back into the abdomen (reduced).  A hernia that traps abdominal tissue inside the hernia (incarcerated hernia). This type of hernia cannot be reduced.  A hernia that cuts off blood flow to the tissues inside the hernia (strangulated hernia). The tissues can start to die if this happens. This type of hernia requires emergency treatment. What are the causes? An umbilical hernia happens when tissue inside the abdomen presses on a weak area of the abdominal muscles. What increases the risk? You may have a greater  risk of this condition if you:  Are obese.  Have had several pregnancies.  Have a buildup of fluid inside your abdomen (ascites).  Have had surgery that weakens the abdominal muscles. What are the signs or symptoms? The main symptom of this condition is a painless bulge at or near the belly button. A reducible hernia may be visible only when you strain, lift something heavy, or cough. Other symptoms may include:  Dull pain.  A feeling of pressure. Symptoms of a strangulated hernia may include:  Pain that gets increasingly worse.  Nausea and vomiting.  Pain when pressing on the hernia.  Skin over the hernia becoming red or purple.  Constipation.  Blood in the stool. How is this diagnosed? This condition may be diagnosed based on:  A physical exam. You may be asked to cough or strain while standing. These actions increase the pressure inside your abdomen and force the hernia through the opening in your muscles. Your health care provider may try to reduce the hernia by pressing on it.  Your symptoms and medical history. How is this treated? Surgery is the only treatment for an umbilical hernia. Surgery for a strangulated hernia is done as soon as possible. If you have a small hernia that is not incarcerated, you may need to lose weight before having surgery. Follow these instructions at home:  Lose weight, if told by your health care provider.  Do not try to push the hernia back in.  Watch your hernia for any changes in color or size. Tell your health care provider if any changes occur.  You may need to avoid activities that increase pressure on your hernia.  Do not lift anything that is heavier than 10 lb (4.5 kg) until your health care provider says that this is safe.  Take over-the-counter and prescription medicines only as told by your health care provider.  Keep all follow-up visits as told by your health care provider. This is important. Contact a health care  provider if:  Your hernia gets larger.  Your hernia becomes painful. Get help right away if:  You develop sudden, severe pain near the area of your hernia.  You have pain as well as nausea or vomiting.  You have pain and the skin over your hernia changes color.  You develop a fever. This information is not intended to replace advice given to you by your health care provider. Make sure you discuss any questions you have with your health care provider. Document Revised: 07/28/2017 Document Reviewed: 12/14/2016 Elsevier Patient Education  2020 [employer].

## 2019-09-04 NOTE — Progress Notes (Signed)
Patient ID: Theresa Burke, female   DOB: May 26, 1981, 39 y.o.   MRN: [id number]  HPI Theresa Burke is a 39 y.o. female seen for evaluation of ventral hernia.  Of note she does have a BMI of 39 and has had this hernia for couple years at least.  She did have an incarceration last year less than 6 months ago and was able to be reduced at the bedside by the emergency room.  She continues to have intermittent pains within the abdominal wall that are sharp moderate intensity and worsening with Valsalva.  No nausea no vomiting.  She did have a CT scan that I have personally reviewed showing evidence of ventral hernia measuring about 2-1/2 cm.  Supraumbilical area.  She is able to perform more than 4 METS of activity without any shortness of breath or chest pain.  She did have recent EGD that I have personally reviewed the images showing evidence of erosion within the esophagus pathology also reviewed with the patient showing no evidence of cancer.  HPI  Past Medical History:  Diagnosis Date  . Anxiety   . GERD (gastroesophageal reflux disease)     Past Surgical History:  Procedure Laterality Date  . BIOPSY  08/25/2019   Procedure: BIOPSY;  Surgeon: Graylin Shiver, MD;  Location: [facility];  Service: Endoscopy;;  . ESOPHAGOGASTRODUODENOSCOPY (EGD) WITH PROPOFOL N/A 08/25/2019   Procedure: ESOPHAGOGASTRODUODENOSCOPY (EGD) WITH PROPOFOL;  Surgeon: Graylin Shiver, MD;  Location: [facility];  Service: Endoscopy;  Laterality: N/A;  . NO PAST SURGERIES      Family History  Problem Relation Age of Onset  . Cancer Mother   . Cervical cancer Mother   . Healthy Father   . Other Other        doesn't really know - but no h/o CAD, HLD HTN or DM.   Marland Kitchen Hypertension Sister     Social History Social History   Tobacco Use  . Smoking status: Former Games developer  . Smokeless tobacco: Never Used  Substance Use Topics  . Alcohol use: Yes    Comment: social  . Drug use: Never    No Known  Allergies  Current Outpatient Medications  Medication Sig Dispense Refill  . acetaminophen (TYLENOL) 500 MG tablet Take 1,000 mg by mouth 2 (two) times daily as needed for moderate pain or headache.    . esomeprazole (NEXIUM) 40 MG capsule Take 1 capsule (40 mg total) by mouth 2 (two) times daily. 180 capsule 1  . sucralfate (CARAFATE) 1 GM/10ML suspension Take 10 mLs (1 g total) by mouth 4 (four) times daily -  with meals and at bedtime. 420 mL 0  . Zinc 50 MG TABS Take 50 mg by mouth daily.     No current facility-administered medications for this visit.     Review of Systems Full ROS  was asked and was negative except for the information on the HPI  Physical Exam Blood pressure 115/80, pulse 73, temperature 97.7 F (36.5 C), temperature source Temporal, height 5' 4.5" (1.638 m), weight 236 lb 6.4 oz (107.2 kg), last menstrual period 08/12/2019, SpO2 99 %. CONSTITUTIONAL: NAD, Obese EYES: Pupils are equal, round, and reactive to light, Sclera are non-icteric. EARS, NOSE, MOUTH AND THROAT: Wearing a mask.Marland Kitchen Hearing is intact to voice. LYMPH NODES:  Lymph nodes in the neck are normal. RESPIRATORY:  Lungs are clear. There is normal respiratory effort, with equal breath sounds bilaterally, and without pathologic use of accessory muscles. CARDIOVASCULAR: Heart  is regular without murmurs, gallops, or rubs. GI: The abdomen is soft and nondistended.  Tender ventral hernia.  Seems to be reducible although exam is limited.  There are no palpable masses. There is no hepatosplenomegaly. There are normal bowel sounds in all quadrants. GU: Rectal deferred.   MUSCULOSKELETAL: Normal muscle strength and tone. No cyanosis or edema.   SKIN: Turgor is good and there are no pathologic skin lesions or ulcers. NEUROLOGIC: Motor and sensation is grossly normal. Cranial nerves are grossly intact. PSYCH:  Oriented to person, place and time. Affect is normal.  Data Reviewed  I have personally reviewed the  patient's imaging, laboratory findings and medical records.    Assessment/Plan 39 year old female with symptomatic umbilical hernia with evidence of strangulation in the past.  She does have a BMI of 39 but has crescendo symptoms regarding the ventral hernia and a previous evidence of strangulation that was reduced.  With this findings I had a conversation with the patient regarding her disease process.  Typically I will have this patient optimize her weight before surgical intervention but I do think that given the crescendo symptoms she may have another incarceration that will have more disastrous consequences. I did express my thought process to the patient and given the circumstances I am okay with proceeding with repair upfront.  I do think that she will be a good candidate for robotic approach.  I did encourage her to lose as much weight as possible in the interim.  Procedure discussed with the patient in detail.  Risk, benefits and possible complications including but not limited to: Bleeding, infection, injury to adjacent structures, conversion to open chronic pain and recurrence.  She understands and wishes to proceed  Caroleen Hamman, MD FACS General Surgeon 09/04/2019, 11:07 AM

## 2019-09-05 ENCOUNTER — Telehealth: Payer: Self-pay | Admitting: Surgery

## 2019-09-05 NOTE — Telephone Encounter (Signed)
Pt has been advised of pre admission date/time, Covid Testing date and Surgery date.  Surgery Date: 09/12/19 Preadmission Testing Date: 09/08/19 (phone 8a-1p) Covid Testing Date: 09/11/19 - patient [facility] [facility])  Patient has been made aware to call [phone], between 1-3:00pm the day before surgery, to find out what time to arrive.    Theresa Burke is also aware that Selena Batten w/[employer] left her a msg earlier today to discuss her benefits & restrictions/limitations/exclusions, so she will need to reach out to her to discuss available options.  The pt acknowledged understanding & stated she will make contact in the morning to discuss further.

## 2019-09-08 ENCOUNTER — Inpatient Hospital Stay: Admission: RE | Admit: 2019-09-08 | Payer: No Typology Code available for payment source | Source: Ambulatory Visit

## 2019-09-11 ENCOUNTER — Other Ambulatory Visit: Payer: No Typology Code available for payment source

## 2019-09-13 ENCOUNTER — Telehealth: Payer: Self-pay | Admitting: Surgery

## 2019-09-13 NOTE — Telephone Encounter (Signed)
Outbound call made to the pt & a message left requesting a call back to provide the following info regarding pre admission date/time, Covid Testing date and Surgery date.  Surgery Date: 10/12/19 Preadmission Testing Date: 10/09/19 (phone 8a-1p) Covid Testing Date: 10/10/19 - patient advised to go to [facility] ([address] [facility])  Please also remind the patient must call [phone], between 1-3:00pm the day before surgery, to find out what time to arrive.    Thank you

## 2019-09-20 ENCOUNTER — Telehealth: Payer: Self-pay | Admitting: *Deleted

## 2019-09-20 NOTE — Telephone Encounter (Signed)
Outbound call made & message left requesting a call back to provide the previously requested information for Theresa Burke in the [facility].  Thank you

## 2019-09-20 NOTE — Telephone Encounter (Signed)
Patient called and stated that she had spoke with a Selena Batten w/[employer] about her benefits &restrictions/limitations/exclusions and would like to talk with her again about some benefits but she doesn't remember the contact information. Do you have the information that you can provider her with?

## 2019-10-09 ENCOUNTER — Telehealth: Payer: Self-pay | Admitting: Surgery

## 2019-10-09 ENCOUNTER — Encounter
Admission: RE | Admit: 2019-10-09 | Discharge: 2019-10-09 | Disposition: A | Discharge status: Home / Self Care | Payer: No Typology Code available for payment source | Source: Ambulatory Visit | Attending: Surgery | Admitting: Surgery

## 2019-10-09 ENCOUNTER — Other Ambulatory Visit
Admission: RE | Admit: 2019-10-09 | Discharge: 2019-10-09 | Disposition: A | Discharge status: Home / Self Care | Payer: No Typology Code available for payment source | Source: Ambulatory Visit | Attending: Surgery | Admitting: Surgery

## 2019-10-09 ENCOUNTER — Other Ambulatory Visit: Payer: Self-pay

## 2019-10-09 MED ORDER — PROMETHAZINE HCL 25 MG/ML IJ SOLN: 6.2500 mg | INTRAMUSCULAR | Status: DC | PRN

## 2019-10-09 NOTE — Patient Instructions (Signed)
Your procedure is scheduled on: thurs 4/15 Report to [facility]   Day surgery To find out your arrival time please call [phone] between [place] on Wed. 4/14  Remember: Instructions that are not followed completely may result in serious medical risk,  up to and including death, or upon the discretion of your surgeon and anesthesiologist your  surgery may need to be rescheduled.     _X__ 1. Do not eat food after midnight the night before your procedure.                 No gum chewing or hard candies. You may drink clear liquids up to 2 hours                 before you are scheduled to arrive for your surgery- DO not drink clear                 liquids within 2 hours of the start of your surgery.                 Clear Liquids include:  water, apple juice without pulp, clear Gatorade, G2 or                  Gatorade Zero (avoid Red/Purple/Blue), Black Coffee or Tea (Do not add                 anything to coffee or tea). _____2.   Complete the carbohydrate drink provided to you, 2 hours before arrival.  __X__2.  On the morning of surgery brush your teeth with toothpaste and water, you                may rinse your mouth with mouthwash if you wish.  Do not swallow any toothpaste of mouthwash.     _X__ 3.  No Alcohol for 24 hours before or after surgery.   ___ 4.  Do Not Smoke or use e-cigarettes For 24 Hours Prior to Your Surgery.                 Do not use any chewable tobacco products for at least 6 hours prior to                 surgery.  ____  5.  Bring all medications with you on the day of surgery if instructed.   _x___  6.  Notify your doctor if there is any change in your medical condition      (cold, fever, infections).     Do not wear jewelry, make-up, hairpins, clips or nail polish. Do not wear lotions, powders, or perfumes. You may wear deodorant. Do not shave 48 hours prior to surgery. Men may shave face and neck. Do not bring valuables to  the hospital.    [facility] is not responsible for any belongings or valuables.  Contacts, dentures or bridgework may not be worn into surgery. Leave your suitcase in the car. After surgery it may be brought to your room. For patients admitted to the hospital, discharge time is determined by your treatment team.   Patients discharged the day of surgery will not be allowed to drive home.   Make arrangements for someone to be with you for the first 24 hours of your Same Day Discharge.    Please read over the following fact sheets that you were given:    _x___ Take these medicines the morning of surgery with A SIP OF  WATER:    1. none  2.   3.   4.  5.  6.  ____ Fleet Enema (as directed)   _x___ Use CHG Soap (or wipes) as directed  ____ Use Benzoyl Peroxide Gel as instructed  ____ Use inhalers on the day of surgery  ____ Stop metformin 2 days prior to surgery    ____ Take 1/2 of usual insulin dose the night before surgery. No insulin the morning          of surgery.   ____ Stop Coumadin/Plavix/aspirin on   __x__ Stop Anti-inflammatories no ibuprofen aleve or aspirin until after surgery.  May take tylenol   ____ Stop supplements until after surgery.    ____ Bring C-Pap to the hospital.

## 2019-10-09 NOTE — Telephone Encounter (Signed)
Final outreach made & message left requesting a call back regarding her upcoming surgery.  Both our office & [facility] have made multiple attempts to contact w/o any response.  The msg today indicated that if no response was received by end of business today, her sx would be cancelled.

## 2019-10-10 ENCOUNTER — Encounter
Admission: RE | Admit: 2019-10-10 | Discharge: 2019-10-10 | Disposition: A | Discharge status: Home / Self Care | Payer: No Typology Code available for payment source | Source: Ambulatory Visit | Attending: Surgery | Admitting: Surgery

## 2019-10-10 ENCOUNTER — Other Ambulatory Visit
Admission: RE | Admit: 2019-10-10 | Discharge: 2019-10-10 | Disposition: A | Discharge status: Home / Self Care | Payer: HRSA Program | Source: Ambulatory Visit | Attending: Surgery | Admitting: Surgery

## 2019-10-10 DIAGNOSIS — Z20822 Contact with and (suspected) exposure to covid-19: Secondary | ICD-10-CM | POA: Diagnosis not present

## 2019-10-10 DIAGNOSIS — Z01812 Encounter for preprocedural laboratory examination: Secondary | ICD-10-CM | POA: Diagnosis present

## 2019-10-10 LAB — SARS CORONAVIRUS 2 (TAT 6-24 HRS): SARS Coronavirus 2: NEGATIVE

## 2019-10-10 NOTE — Telephone Encounter (Signed)
Pt returned call & advised she spoke w/[facility]missions yesterday, as well as completed her COVID testing today, so she should be all set & good to go for sx on 10/12/19.  Spk w/Ashley in preadmission dept who confirmed this info.

## 2019-10-12 ENCOUNTER — Encounter
Admission: RE | Disposition: A | Discharge status: Home / Self Care | Payer: Self-pay | Source: Home / Self Care | Attending: Surgery

## 2019-10-12 ENCOUNTER — Ambulatory Visit
Admission: RE | Admit: 2019-10-12 | Discharge: 2019-10-12 | Disposition: A | Discharge status: Home / Self Care | Payer: Self-pay | Attending: Surgery | Admitting: Surgery

## 2019-10-12 ENCOUNTER — Ambulatory Visit: Payer: Self-pay | Admitting: Anesthesiology

## 2019-10-12 ENCOUNTER — Other Ambulatory Visit: Payer: Self-pay

## 2019-10-12 ENCOUNTER — Encounter: Payer: Self-pay | Admitting: Surgery

## 2019-10-12 DIAGNOSIS — Z79899 Other long term (current) drug therapy: Secondary | ICD-10-CM | POA: Insufficient documentation

## 2019-10-12 DIAGNOSIS — K439 Ventral hernia without obstruction or gangrene: Secondary | ICD-10-CM | POA: Insufficient documentation

## 2019-10-12 DIAGNOSIS — Z87891 Personal history of nicotine dependence: Secondary | ICD-10-CM | POA: Insufficient documentation

## 2019-10-12 HISTORY — PX: XI ROBOTIC ASSISTED VENTRAL HERNIA: SHX6789

## 2019-10-12 LAB — POCT PREGNANCY, URINE: Preg Test, Ur: NEGATIVE

## 2019-10-12 LAB — URINE DRUG SCREEN, QUALITATIVE (ARMC ONLY)
Amphetamines, Ur Screen: NOT DETECTED
Barbiturates, Ur Screen: NOT DETECTED
Benzodiazepine, Ur Scrn: NOT DETECTED
Cannabinoid 50 Ng, Ur ~~LOC~~: NOT DETECTED
Cocaine Metabolite,Ur ~~LOC~~: NOT DETECTED
MDMA (Ecstasy)Ur Screen: NOT DETECTED
Methadone Scn, Ur: NOT DETECTED
Opiate, Ur Screen: NOT DETECTED
Phencyclidine (PCP) Ur S: NOT DETECTED
Tricyclic, Ur Screen: NOT DETECTED

## 2019-10-12 SURGERY — REPAIR, HERNIA, VENTRAL, ROBOT-ASSISTED
Anesthesia: General | Site: Abdomen

## 2019-10-12 MED ORDER — ACETAMINOPHEN 500 MG PO TABS
ORAL_TABLET | ORAL | Status: AC
  Administered 2019-10-12: 1000 mg via ORAL
  Filled 2019-10-12: qty 2

## 2019-10-12 MED ORDER — ROCURONIUM BROMIDE 100 MG/10ML IV SOLN
INTRAVENOUS | Status: DC | PRN
  Administered 2019-10-12 (×2): 20 mg via INTRAVENOUS
  Administered 2019-10-12: 50 mg via INTRAVENOUS

## 2019-10-12 MED ORDER — FAMOTIDINE 20 MG PO TABS
ORAL_TABLET | ORAL | Status: AC
  Administered 2019-10-12: 20 mg
  Filled 2019-10-12: qty 1

## 2019-10-12 MED ORDER — PROPOFOL 10 MG/ML IV BOLUS
INTRAVENOUS | Status: DC | PRN
  Administered 2019-10-12: 160 mg via INTRAVENOUS

## 2019-10-12 MED ORDER — MIDAZOLAM HCL 2 MG/2ML IJ SOLN
INTRAMUSCULAR | Status: AC
  Filled 2019-10-12: qty 2

## 2019-10-12 MED ORDER — BUPIVACAINE HCL (PF) 0.25 % IJ SOLN
INTRAMUSCULAR | Status: AC
  Filled 2019-10-12: qty 30

## 2019-10-12 MED ORDER — CEFAZOLIN SODIUM-DEXTROSE 2-4 GM/100ML-% IV SOLN
2.0000 g | INTRAVENOUS | Status: AC
  Administered 2019-10-12: 2 g via INTRAVENOUS

## 2019-10-12 MED ORDER — HYDROCODONE-ACETAMINOPHEN 7.5-325 MG PO TABS
1.0000 | ORAL_TABLET | Freq: Once | ORAL | Status: AC | PRN
  Administered 2019-10-12: 1 via ORAL
  Filled 2019-10-12: qty 1

## 2019-10-12 MED ORDER — ACETAMINOPHEN 325 MG PO TABS: 325.0000 mg | ORAL_TABLET | ORAL | Status: DC | PRN

## 2019-10-12 MED ORDER — CELECOXIB 200 MG PO CAPS: 200.0000 mg | ORAL_CAPSULE | ORAL | Status: AC

## 2019-10-12 MED ORDER — CHLORHEXIDINE GLUCONATE CLOTH 2 % EX PADS: 6.0000 | MEDICATED_PAD | Freq: Once | CUTANEOUS | Status: DC

## 2019-10-12 MED ORDER — EPHEDRINE SULFATE 50 MG/ML IJ SOLN
INTRAMUSCULAR | Status: DC | PRN
  Administered 2019-10-12: 10 mg via INTRAVENOUS

## 2019-10-12 MED ORDER — MEPERIDINE HCL 50 MG/ML IJ SOLN: 6.2500 mg | INTRAMUSCULAR | Status: DC | PRN

## 2019-10-12 MED ORDER — ONDANSETRON HCL 4 MG/2ML IJ SOLN
INTRAMUSCULAR | Status: AC
  Filled 2019-10-12: qty 2

## 2019-10-12 MED ORDER — LIDOCAINE HCL (CARDIAC) PF 100 MG/5ML IV SOSY
PREFILLED_SYRINGE | INTRAVENOUS | Status: DC | PRN
  Administered 2019-10-12: 100 mg via INTRAVENOUS

## 2019-10-12 MED ORDER — EPINEPHRINE PF 1 MG/ML IJ SOLN
INTRAMUSCULAR | Status: AC
  Filled 2019-10-12: qty 1

## 2019-10-12 MED ORDER — KETOROLAC TROMETHAMINE 30 MG/ML IJ SOLN
INTRAMUSCULAR | Status: AC
  Administered 2019-10-12: 30 mg via INTRAVENOUS
  Filled 2019-10-12: qty 1

## 2019-10-12 MED ORDER — FENTANYL CITRATE (PF) 100 MCG/2ML IJ SOLN
25.0000 ug | INTRAMUSCULAR | Status: DC | PRN
  Administered 2019-10-12 (×2): 50 ug via INTRAVENOUS

## 2019-10-12 MED ORDER — SCOPOLAMINE 1 MG/3DAYS TD PT72
1.0000 | MEDICATED_PATCH | TRANSDERMAL | Status: DC
  Administered 2019-10-12: 1.5 mg via TRANSDERMAL

## 2019-10-12 MED ORDER — FENTANYL CITRATE (PF) 100 MCG/2ML IJ SOLN
INTRAMUSCULAR | Status: DC | PRN
  Administered 2019-10-12 (×2): 50 ug via INTRAVENOUS

## 2019-10-12 MED ORDER — GABAPENTIN 300 MG PO CAPS: 300.0000 mg | ORAL_CAPSULE | ORAL | Status: AC

## 2019-10-12 MED ORDER — SUGAMMADEX SODIUM 500 MG/5ML IV SOLN
INTRAVENOUS | Status: DC | PRN
  Administered 2019-10-12: 200 mg via INTRAVENOUS

## 2019-10-12 MED ORDER — CELECOXIB 200 MG PO CAPS
ORAL_CAPSULE | ORAL | Status: AC
  Administered 2019-10-12: 200 mg via ORAL
  Filled 2019-10-12: qty 1

## 2019-10-12 MED ORDER — FENTANYL CITRATE (PF) 100 MCG/2ML IJ SOLN: 25.0000 ug | INTRAMUSCULAR | Status: DC | PRN

## 2019-10-12 MED ORDER — FENTANYL CITRATE (PF) 100 MCG/2ML IJ SOLN
INTRAMUSCULAR | Status: AC
  Filled 2019-10-12: qty 2

## 2019-10-12 MED ORDER — FAMOTIDINE 20 MG PO TABS: 20.0000 mg | ORAL_TABLET | Freq: Once | ORAL | Status: DC

## 2019-10-12 MED ORDER — SCOPOLAMINE 1 MG/3DAYS TD PT72
MEDICATED_PATCH | TRANSDERMAL | Status: AC
  Filled 2019-10-12: qty 1

## 2019-10-12 MED ORDER — BUPIVACAINE LIPOSOME 1.3 % IJ SUSP
INTRAMUSCULAR | Status: AC
  Filled 2019-10-12: qty 20

## 2019-10-12 MED ORDER — PROPOFOL 500 MG/50ML IV EMUL
INTRAVENOUS | Status: AC
  Filled 2019-10-12: qty 50

## 2019-10-12 MED ORDER — SEVOFLURANE IN SOLN
RESPIRATORY_TRACT | Status: AC
  Filled 2019-10-12: qty 250

## 2019-10-12 MED ORDER — HYDROCODONE-ACETAMINOPHEN 5-325 MG PO TABS: 1.0000 | ORAL_TABLET | ORAL | 0 refills | Status: DC | PRN

## 2019-10-12 MED ORDER — DEXAMETHASONE SODIUM PHOSPHATE 10 MG/ML IJ SOLN
INTRAMUSCULAR | Status: AC
  Filled 2019-10-12: qty 1

## 2019-10-12 MED ORDER — PROMETHAZINE HCL 25 MG/ML IJ SOLN: 6.2500 mg | INTRAMUSCULAR | Status: DC | PRN

## 2019-10-12 MED ORDER — GABAPENTIN 300 MG PO CAPS
ORAL_CAPSULE | ORAL | Status: AC
  Administered 2019-10-12: 300 mg via ORAL
  Filled 2019-10-12: qty 1

## 2019-10-12 MED ORDER — VISTASEAL 10 ML SINGLE DOSE KIT
PACK | CUTANEOUS | Status: DC | PRN
  Administered 2019-10-12: 10 mL via TOPICAL

## 2019-10-12 MED ORDER — ACETAMINOPHEN 160 MG/5ML PO SOLN
325.0000 mg | ORAL | Status: DC | PRN
  Filled 2019-10-12: qty 20.3

## 2019-10-12 MED ORDER — BUPIVACAINE LIPOSOME 1.3 % IJ SUSP
INTRAMUSCULAR | Status: DC | PRN
  Administered 2019-10-12: 20 mL

## 2019-10-12 MED ORDER — LACTATED RINGERS IV SOLN: INTRAVENOUS | Status: DC

## 2019-10-12 MED ORDER — ONDANSETRON HCL 4 MG/2ML IJ SOLN
INTRAMUSCULAR | Status: DC | PRN
  Administered 2019-10-12: 4 mg via INTRAVENOUS

## 2019-10-12 MED ORDER — CEFAZOLIN SODIUM-DEXTROSE 2-4 GM/100ML-% IV SOLN
INTRAVENOUS | Status: AC
  Filled 2019-10-12: qty 100

## 2019-10-12 MED ORDER — HYDROCODONE-ACETAMINOPHEN 7.5-325 MG PO TABS
ORAL_TABLET | ORAL | Status: AC
  Filled 2019-10-12: qty 1

## 2019-10-12 MED ORDER — KETOROLAC TROMETHAMINE 30 MG/ML IJ SOLN: 30.0000 mg | Freq: Once | INTRAMUSCULAR | Status: AC | PRN

## 2019-10-12 MED ORDER — ACETAMINOPHEN 500 MG PO TABS: 1000.0000 mg | ORAL_TABLET | ORAL | Status: AC

## 2019-10-12 MED ORDER — BUPIVACAINE-EPINEPHRINE 0.25% -1:200000 IJ SOLN
INTRAMUSCULAR | Status: DC | PRN
  Administered 2019-10-12: 30 mL

## 2019-10-12 MED ORDER — LACTATED RINGERS IV SOLN: INTRAVENOUS | Status: DC | PRN

## 2019-10-12 MED ORDER — DEXAMETHASONE SODIUM PHOSPHATE 10 MG/ML IJ SOLN
INTRAMUSCULAR | Status: DC | PRN
  Administered 2019-10-12: 5 mg via INTRAVENOUS

## 2019-10-12 SURGICAL SUPPLY — 49 items
APPLICATOR VISTASEAL 35 (MISCELLANEOUS) ×2 IMPLANT
BLADE SURG SZ11 CARB STEEL (BLADE) ×3 IMPLANT
CANISTER SUCT 1200ML W/VALVE (MISCELLANEOUS) ×3 IMPLANT
CHLORAPREP W/TINT 26 (MISCELLANEOUS) ×3 IMPLANT
COVER TIP SHEARS 8 DVNC (MISCELLANEOUS) ×1 IMPLANT
COVER TIP SHEARS 8MM DA VINCI (MISCELLANEOUS) ×2
COVER WAND RF STERILE (DRAPES) ×3 IMPLANT
DEFOGGER SCOPE WARMER CLEARIFY (MISCELLANEOUS) ×3 IMPLANT
DERMABOND ADVANCED (GAUZE/BANDAGES/DRESSINGS) ×2
DERMABOND ADVANCED .7 DNX12 (GAUZE/BANDAGES/DRESSINGS) ×1 IMPLANT
DRAPE 3/4 80X56 (DRAPES) ×3 IMPLANT
DRAPE ARM DVNC X/XI (DISPOSABLE) ×4 IMPLANT
DRAPE COLUMN DVNC XI (DISPOSABLE) ×1 IMPLANT
DRAPE DA VINCI XI ARM (DISPOSABLE) ×8
DRAPE DA VINCI XI COLUMN (DISPOSABLE) ×2
DRAPE GENERAL ENDO 106X123.5 (DRAPES) ×2 IMPLANT
ELECT CAUTERY BLADE 6.4 (BLADE) ×3 IMPLANT
ELECT REM PT RETURN 9FT ADLT (ELECTROSURGICAL) ×3
ELECTRODE REM PT RTRN 9FT ADLT (ELECTROSURGICAL) ×1 IMPLANT
GLOVE BIO SURGEON STRL SZ7 (GLOVE) ×6 IMPLANT
GOWN STRL REUS W/ TWL LRG LVL3 (GOWN DISPOSABLE) ×3 IMPLANT
GOWN STRL REUS W/TWL LRG LVL3 (GOWN DISPOSABLE) ×6
GRASPER SUT TROCAR 14GX15 (MISCELLANEOUS) ×3 IMPLANT
IRRIGATION STRYKERFLOW (MISCELLANEOUS) IMPLANT
IRRIGATOR STRYKERFLOW (MISCELLANEOUS)
IV NS 1000ML (IV SOLUTION)
IV NS 1000ML BAXH (IV SOLUTION) IMPLANT
KIT PINK PAD W/HEAD ARE REST (MISCELLANEOUS) ×3
KIT PINK PAD W/HEAD ARM REST (MISCELLANEOUS) ×1 IMPLANT
LABEL OR SOLS (LABEL) ×3 IMPLANT
MESH SOFT 12X12IN BARD (Mesh General) ×2 IMPLANT
NEEDLE HYPO 22GX1.5 SAFETY (NEEDLE) ×3 IMPLANT
OBTURATOR OPTICAL STANDARD 8MM (TROCAR) ×2
OBTURATOR OPTICAL STND 8 DVNC (TROCAR) ×1
OBTURATOR OPTICALSTD 8 DVNC (TROCAR) ×1 IMPLANT
PACK LAP CHOLECYSTECTOMY (MISCELLANEOUS) ×3 IMPLANT
PENCIL ELECTRO HAND CTR (MISCELLANEOUS) ×3 IMPLANT
SEAL CANN UNIV 5-8 DVNC XI (MISCELLANEOUS) ×3 IMPLANT
SEAL XI 5MM-8MM UNIVERSAL (MISCELLANEOUS) ×6
SOLUTION ELECTROLUBE (MISCELLANEOUS) ×3 IMPLANT
SPONGE LAP 18X18 RF (DISPOSABLE) ×3 IMPLANT
SUT MNCRL 4-0 (SUTURE) ×4
SUT MNCRL 4-0 27XMFL (SUTURE) ×2
SUT STRATAFIX PDS 30 CT-1 (SUTURE) ×3 IMPLANT
SUT VICRYL 0 AB UR-6 (SUTURE) ×6 IMPLANT
SUT VLOC 90 2/L VL 12 GS22 (SUTURE) ×6 IMPLANT
SUTURE MNCRL 4-0 27XMF (SUTURE) ×1 IMPLANT
TROCAR 130MM GELPORT  DAV (MISCELLANEOUS) ×3 IMPLANT
TUBING EVAC SMOKE HEATED PNEUM (TUBING) ×3 IMPLANT

## 2019-10-12 NOTE — Anesthesia Preprocedure Evaluation (Addendum)
Anesthesia Evaluation  Patient identified by MRN, date of birth, ID band Patient awake    Reviewed: Allergy & Precautions, H&P , NPO status , Patient's Chart, lab work & pertinent test results  Airway Mallampati: III  TM Distance: >3 FB Neck ROM: full    Dental  (+) Chipped   Pulmonary neg COPD, Patient abstained from smoking., former smoker,           Cardiovascular (-) anginanegative cardio ROS  (-) dysrhythmias      Neuro/Psych Anxiety negative neurological ROS  negative psych ROS   GI/Hepatic Neg liver ROS, GERD  Medicated,  Endo/Other  negative endocrine ROS  Renal/GU      Musculoskeletal   Abdominal   Peds  Hematology negative hematology ROS (+)   Anesthesia Other Findings Past Medical History: No date: Anxiety No date: GERD (gastroesophageal reflux disease)  Past Surgical History: 08/25/2019: BIOPSY     Comment:  Procedure: BIOPSY;  Surgeon: Graylin Shiver, MD;                Location: [facility];  Service: Endoscopy;; 08/25/2019: ESOPHAGOGASTRODUODENOSCOPY (EGD) WITH PROPOFOL; N/A     Comment:  Procedure: ESOPHAGOGASTRODUODENOSCOPY (EGD) WITH               PROPOFOL;  Surgeon: Graylin Shiver, MD;  Location: [facility];  Service: Endoscopy;  Laterality: N/A; No date: NO PAST SURGERIES  BMI    Body Mass Index: 39.87 kg/m      Reproductive/Obstetrics negative OB ROS                             Anesthesia Physical Anesthesia Plan  ASA: II  Anesthesia Plan: General ETT   Post-op Pain Management:    Induction:   PONV Risk Score and Plan: Ondansetron, Dexamethasone, Midazolam and Treatment may vary due to age or medical condition  Airway Management Planned:   Additional Equipment:   Intra-op Plan:   Post-operative Plan:   Informed Consent: I have reviewed the patients History and Physical, chart, labs and discussed the procedure including the  risks, benefits and alternatives for the proposed anesthesia with the patient or authorized representative who has indicated his/her understanding and acceptance.     Dental Advisory Given  Plan Discussed with: Anesthesiologist  Anesthesia Plan Comments:        Anesthesia Quick Evaluation

## 2019-10-12 NOTE — Discharge Instructions (Signed)

## 2019-10-12 NOTE — Op Note (Signed)
Robotic assisted laparoscopic ventral hernia Repair  using  10x15 cms Polypropylene macroporus soft BARD mesh placed in a retrorectus fashion Bilateral myofascial flaps  trunk involving an area of 20 x 14 cm    Pre-operative Diagnosis: ventral hernia   Post-operative Diagnosis: same   Surgeon: Sterling Big, MD FACS   Anesthesia: Gen. with endotracheal tube     Findings: 3 cms ventral hernia   Estimated Blood Loss: 5  cc         Complications: none             Procedure Details  The patient was seen again in the Holding Room. The benefits, complications, treatment options, and expected outcomes were discussed with the patient. The risks of bleeding, infection, recurrence of symptoms, failure to resolve symptoms, bowel injury, mesh placement, mesh infection, any of which could require further surgery were reviewed with the patient. The likelihood of improving the patient's symptoms with return to their baseline status is good.  The patient and/or family concurred with the proposed plan, giving informed consent.  The patient was taken to Operating Room, identified and the procedure verified.  A Time Out was held and the above information confirmed.   Prior to the induction of general anesthesia, antibiotic prophylaxis was administered. VTE prophylaxis was in place. General endotracheal anesthesia was then administered and tolerated well. After the induction, the abdomen was prepped with Chloraprep and draped in the sterile fashion. The patient was positioned in the supine position.   We used a left upper quadrant subcostal incision and using a cutdown technique were able to identify the fascia both anteriorly and posteriorly elevated incised and 2 stay sutures were placed.  Hassan trocar inserted and pneumoperitoneum obtained.  No hemodynamic compromise.   2 additional 8 mm ports were placed under direct visualization.  I visualized the hernia and there was an epigastric ventral hernia  measuring approximately 3 cm.  At this time I went ahead and inserted the  mesh .   The robot was brought to the surgical field and docked in the standard fashion.  We made sure that all instrumentation was kept under direct vision at all times and there was no collision between the arms.  I scrubbed out and went to the console.   Visualized the hernia defect and did consider that this was a good approach for retrorectus placement.  I created a flap of the peritoneum on the left abdominal wall in the same I oval fashion.  I make sure that incorporated release 5 cm on both cranial and caudal fashion from the ventral defect.  We develop an extraperitoneal plane, the plane is developed between the recti muscles ventrally and the posterior rectus sheath along with peritoneum dorsally and bilaterally.  The retrorectus compartments are separated in the midline by the linea alba. The posterior sheath is divided about 3 mm away from the midline, taking care not to breach the layer of pre-peritoneal fat and peritoneum dorsally, on both the sides, to create a single retrorectus compartment.  All the hernia contents were reduced  The mesh was laying against the rectus abdominal muscles bilaterally to have a good overlap of the defect.  It was secured with Vistaseal and it laid very nice against the abdominal wall The peritoneum flap was closed with a running V lock suture in the standard fashion. A second look laparoscopy revealed no evidence of intra-abdominal injury.    All the needles and foreign objects were removed under direct visualization.  The instruments were removed and the robot was undocked.  I scrubbed back in, The laparoscopic ports were removed under direct visualization and the pneumoperitoneum was deflated.   Incisions were closed with  4-0 Monocryl  And the fascial sutures approximated in the standard fashion Dermabond was used to coat the skin.  Liposomal Marcaine  was used to inject all the  incision sites. Patient tolerated procedure well and there were no immediate complications. Needle and laparotomy counts were correct    Caroleen Hamman, MD, FACS
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
## 2019-10-12 NOTE — Anesthesia Procedure Notes (Signed)
Procedure Name: Intubation Date/Time: 10/12/2019 7:44 AM Performed by: Almeta Monas, CRNA Pre-anesthesia Checklist: Patient identified, Patient being monitored, Timeout performed, Emergency Drugs available and Suction available Patient Re-evaluated:Patient Re-evaluated prior to induction Oxygen Delivery Method: Circle system utilized Preoxygenation: Pre-oxygenation with 100% oxygen Induction Type: IV induction Ventilation: Mask ventilation without difficulty Laryngoscope Size: 3 and McGraph Grade View: Grade I Tube type: Oral Tube size: 7.0 mm Number of attempts: 1 Airway Equipment and Method: Stylet and Video-laryngoscopy Placement Confirmation: ETT inserted through vocal cords under direct vision,  positive ETCO2 and breath sounds checked- equal and bilateral Secured at: 21 cm Tube secured with: Tape Dental Injury: Teeth and Oropharynx as per pre-operative assessment

## 2019-10-12 NOTE — Transfer of Care (Signed)
Immediate Anesthesia Transfer of Care Note  Patient: Theresa Burke  Procedure(s) Performed: XI ROBOTIC ASSISTED VENTRAL HERNIA (N/A Abdomen)  Patient Location: PACU  Anesthesia Type:General  Level of Consciousness: sedated  Airway & Oxygen Therapy: Patient Spontanous Breathing and Patient connected to face mask oxygen  Post-op Assessment: Report given to RN  Post vital signs: Reviewed and stable  Last Vitals:  Vitals Value Taken Time  BP 131/72 10/12/19 1136  Temp    Pulse 74 10/12/19 1137  Resp 10 10/12/19 1137  SpO2 100 % 10/12/19 1137  Vitals shown include unvalidated device data.  Last Pain:  Vitals:   10/12/19 0623  TempSrc: Tympanic  PainSc: 3          Complications: No apparent anesthesia complications

## 2019-10-13 NOTE — Anesthesia Postprocedure Evaluation (Signed)
Anesthesia Post Note  Patient: Theresa Burke  Procedure(s) Performed: XI ROBOTIC ASSISTED VENTRAL HERNIA (N/A Abdomen)  Patient location during evaluation: PACU Anesthesia Type: General Level of consciousness: awake and alert Pain management: pain level controlled Vital Signs Assessment: post-procedure vital signs reviewed and stable Respiratory status: spontaneous breathing, nonlabored ventilation and respiratory function stable Cardiovascular status: blood pressure returned to baseline and stable Postop Assessment: no apparent nausea or vomiting Anesthetic complications: no     Last Vitals:  Vitals:   10/12/19 1337 10/12/19 1426  BP: 123/72 123/65  Pulse: 72 79  Resp: 18   Temp:    SpO2: 100% 100%    Last Pain:  Vitals:   10/13/19 0812  TempSrc:   PainSc: 5                  Karleen Hampshire

## 2019-10-20 ENCOUNTER — Other Ambulatory Visit: Payer: Self-pay | Admitting: Physician Assistant

## 2019-10-20 ENCOUNTER — Telehealth: Payer: Self-pay | Admitting: *Deleted

## 2019-10-20 MED ORDER — HYDROCODONE-ACETAMINOPHEN 5-325 MG PO TABS: 1.0000 | ORAL_TABLET | ORAL | 0 refills | Status: DC | PRN

## 2019-10-20 NOTE — Telephone Encounter (Signed)
Patient called and stated that she is still in a lot of pain and wants to know if she can get a refill on hydrocodone. She had surgery on 10/12/19 Ventral hernia repair by Dr Everlene Farrier.  Please call and advise

## 2019-10-20 NOTE — Telephone Encounter (Signed)
Spoke with Lynden Oxford PA-C and he will refill the patient's medication.

## 2019-10-20 NOTE — Telephone Encounter (Signed)
She states that the pain has not improved since surgery. She states that it is constant and feels the same as last week after her surgery. She states that she has been having bowel movements. She reports no redness or drainage with the surgical sites and no fever or chills. She has a post op follow up scheduled for this coming Wednesday. She states that she is almost out of pain medication.

## 2019-10-25 ENCOUNTER — Encounter: Payer: Self-pay | Admitting: Surgery

## 2019-10-25 ENCOUNTER — Other Ambulatory Visit: Payer: Self-pay

## 2019-10-25 ENCOUNTER — Ambulatory Visit (INDEPENDENT_AMBULATORY_CARE_PROVIDER_SITE_OTHER): Payer: Self-pay | Admitting: Surgery

## 2019-10-25 VITALS — BP 122/67 | HR 70 | Temp 97.3°F | Ht 64.0 in | Wt 242.6 lb

## 2019-10-25 DIAGNOSIS — Z09 Encounter for follow-up examination after completed treatment for conditions other than malignant neoplasm: Secondary | ICD-10-CM

## 2019-10-25 MED ORDER — IBUPROFEN 800 MG PO TABS: 800.0000 mg | ORAL_TABLET | Freq: Three times a day (TID) | ORAL | 0 refills | Status: AC | PRN

## 2019-10-25 MED ORDER — GABAPENTIN 300 MG PO CAPS: 300.0000 mg | ORAL_CAPSULE | Freq: Three times a day (TID) | ORAL | 0 refills | Status: AC

## 2019-10-25 NOTE — Patient Instructions (Signed)
Dr.Pabon discussed with patient he pain management regimen of Hydrocodone 1-2 tablets every 4-6 hours as needed with Gabapentin 1-2 tablets every 8 hours as needed along with Ibuprofen (800 mg) every 6-8 hours as needed.  GENERAL POST-OPERATIVE PATIENT INSTRUCTIONS FOLLOW-UP:  Please make an appointment with your physician in.  Call your physician immediately if you have any fevers greater than 102.5, drainage from you wound that is not clear or looks infected, persistent bleeding, increasing abdominal pain, problems urinating, or persistent nausea/vomiting.    WOUND CARE INSTRUCTIONS:  Keep a dry clean dressing on the wound if there is drainage. The initial bandage may be removed after 24 hours.  Once the wound has quit draining you may leave it open to air.  If clothing rubs against the wound or causes irritation and the wound is not draining you may cover it with a dry dressing during the daytime.  Try to keep the wound dry and avoid ointments on the wound unless directed to do so.  If the wound becomes bright red and painful or starts to drain infected material that is not clear, please contact your physician immediately.  If the wound is mildly pink and has a thick firm ridge underneath it, this is normal, and is referred to as a healing ridge.  This will resolve over the next 4-6 weeks.  DIET:  You may eat any foods that you can tolerate.  It is a good idea to eat a high fiber diet and take in plenty of fluids to prevent constipation.  If you do become constipated you may want to take a mild laxative or take ducolax tablets on a daily basis until your bowel habits are regular.  Constipation can be very uncomfortable, along with straining, after recent surgery.  ACTIVITY:  You are encouraged to cough and deep breath or use your incentive spirometer if you were given one, every 15-30 minutes when awake.  This will help prevent respiratory complications and low grade fevers post-operatively if you had a  general anesthetic.  You may want to hug a pillow when coughing and sneezing to add additional support to the surgical area, if you had abdominal or chest surgery, which will decrease pain during these times.  You are encouraged to walk and engage in light activity for the next two weeks.  You should not lift more than 20 pounds during this time frame as it could put you at increased risk for complications.  Twenty pounds is roughly equivalent to a plastic bag of groceries.    MEDICATIONS:  Try to take narcotic medications and anti-inflammatory medications, such as tylenol, ibuprofen, naprosyn, etc., with food.  This will minimize stomach upset from the medication.  Should you develop nausea and vomiting from the pain medication, or develop a rash, please discontinue the medication and contact your physician.  You should not drive, make important decisions, or operate machinery when taking narcotic pain medication.  QUESTIONS:  Please feel free to call your physician or the hospital operator if you have any questions, and they will be glad to assist you.

## 2019-10-26 NOTE — Progress Notes (Signed)
S/p robotic TAPP  Having some pain Ambulating, taking PO, no fevers or chills  PE NAD Abd: soft, incision healing well, no infection, Some induration likely related to seroma. No peritonitis, no recurrence  A/p Doing well Advice about NSAIDs, tylenol, gabapentin and ice F/U 2 weeks

## 2019-10-30 ENCOUNTER — Telehealth: Payer: Self-pay | Admitting: *Deleted

## 2019-10-30 NOTE — Telephone Encounter (Signed)
Patient called and stated that she had surgery on 10/12/19 ventral hernia repair by Dr Everlene Farrier and she is still experiencing some inflammation around the incision site and a hard feeling area where the hernia was. She wants to know if this is normal or should she come in to have it check out. Please call and advise

## 2019-10-30 NOTE — Telephone Encounter (Signed)
Patient states that her navel area is still hard and feels like the area is still swollen. She has been using the ice packs. She reports that she is still in pain and would like to be seen again. She will follow up with Dr Everlene Farrier this Wednesday.

## 2019-10-31 IMAGING — CT CT ABD-PELV W/ CM
2 of 5 series · 16 of 46 positions shown, 18 images · IV contrast (APPLIED)
Comparison: CT 03/06/2019, just over 2 weeks ago.

CLINICAL DATA: Acute abdominal pain. Umbilical pain. Known
umbilical hernia.

EXAM:
CT ABDOMEN AND PELVIS WITH CONTRAST
TECHNIQUE: Multidetector CT imaging of the abdomen and pelvis was performed
using the standard protocol following bolus administration of
intravenous contrast.
CONTRAST:  100mL OMNIPAQUE IOHEXOL 300 MG/ML  SOLN

[Series 3: abdomen 5.0 · axial · 0.92mm/px · z∈[-398,+22]mm · 13 of 98 slices shown, 15 images]
[im 7/98  soft-tissue]
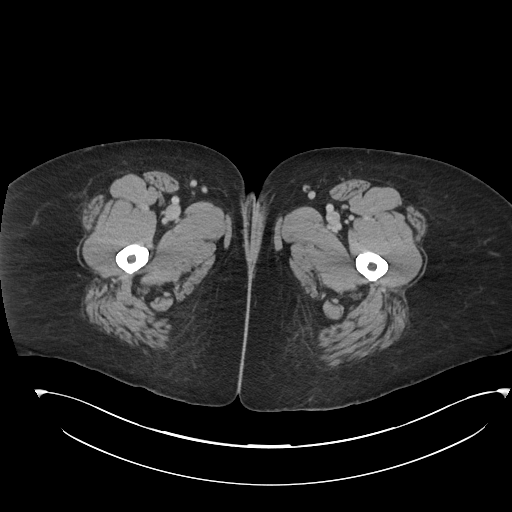
[im 7/98  bone]
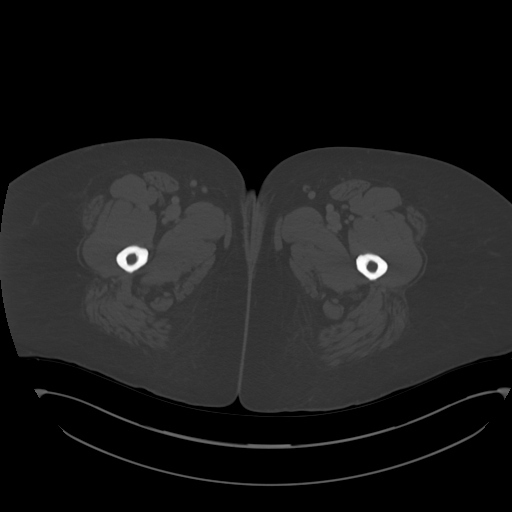
[im 13/98  soft-tissue]
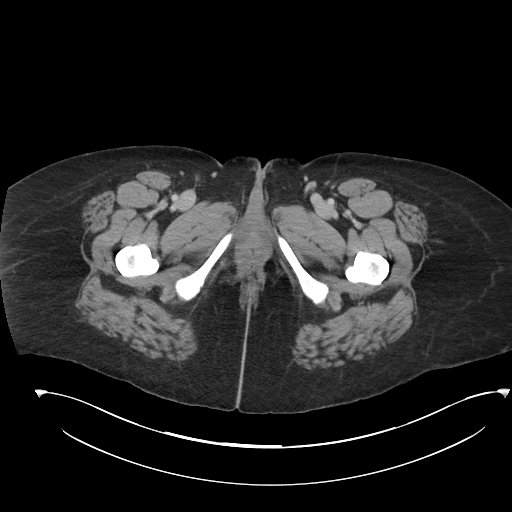
[im 20/98  soft-tissue]
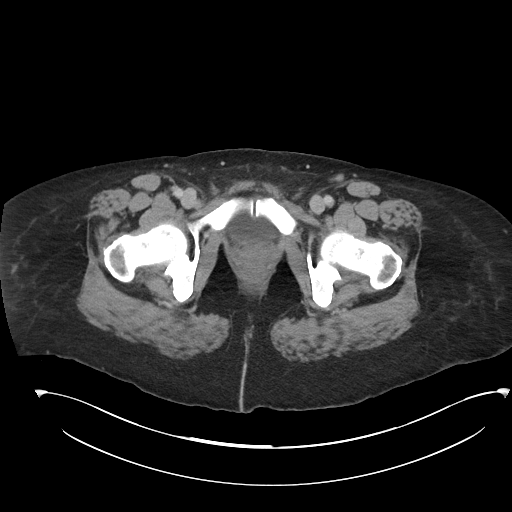
[im 26/98  soft-tissue]
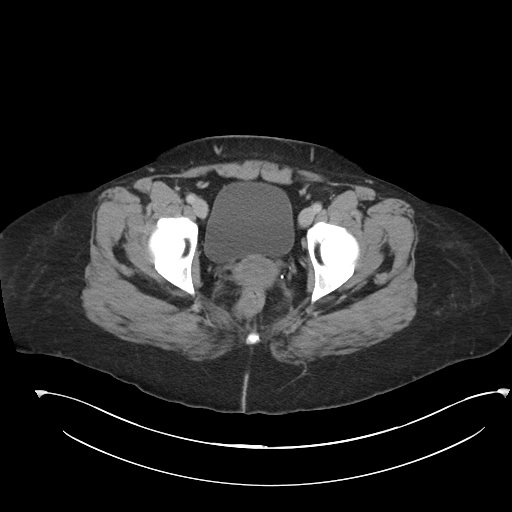
[im 33/98  soft-tissue]
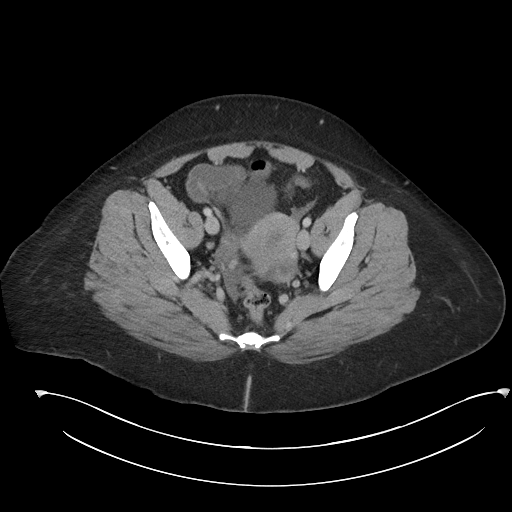
[im 39/98  soft-tissue]
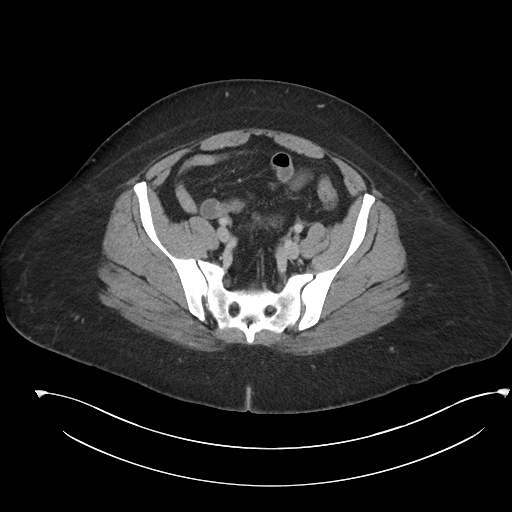
[im 52/98  soft-tissue]
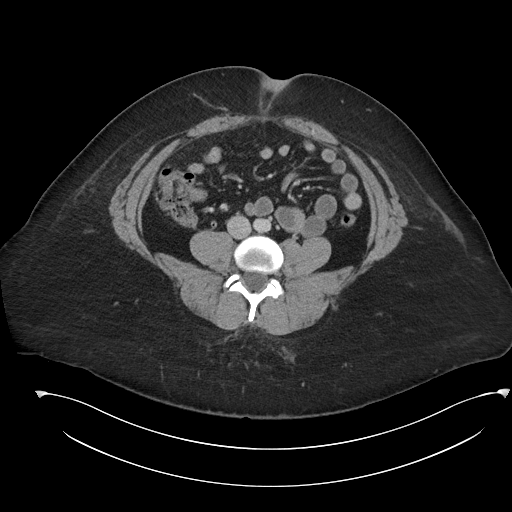
[im 59/98  soft-tissue]
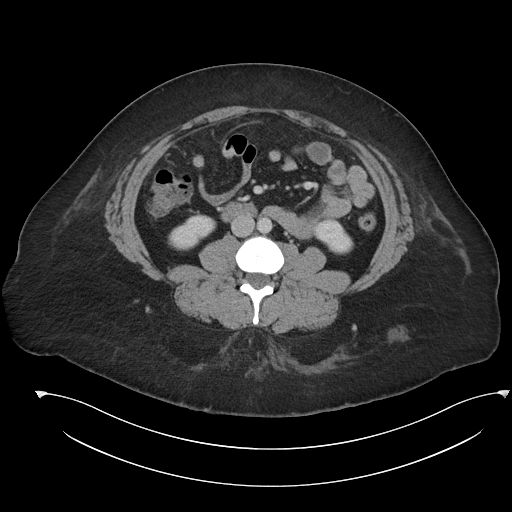
[im 65/98  soft-tissue]
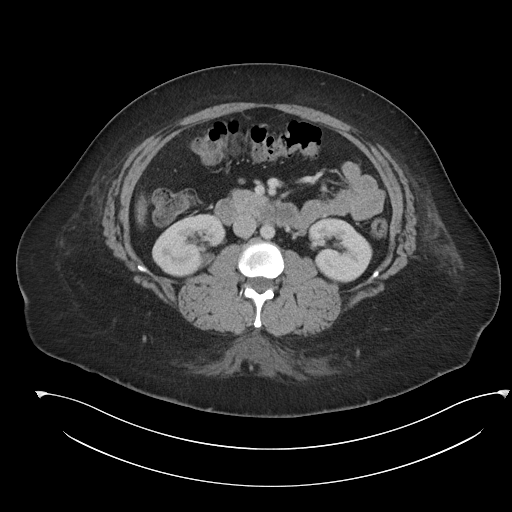
[im 65/98  bone]
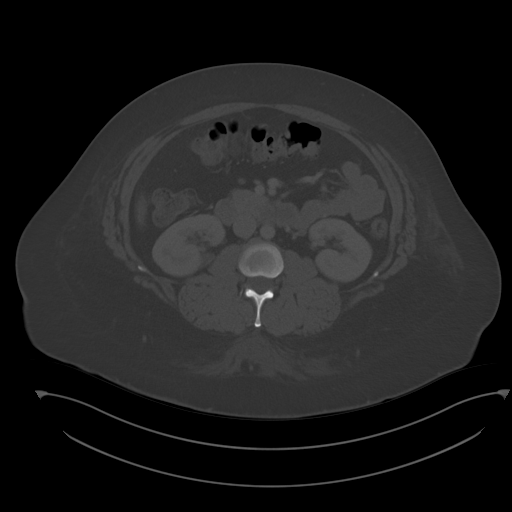
[im 72/98  soft-tissue]
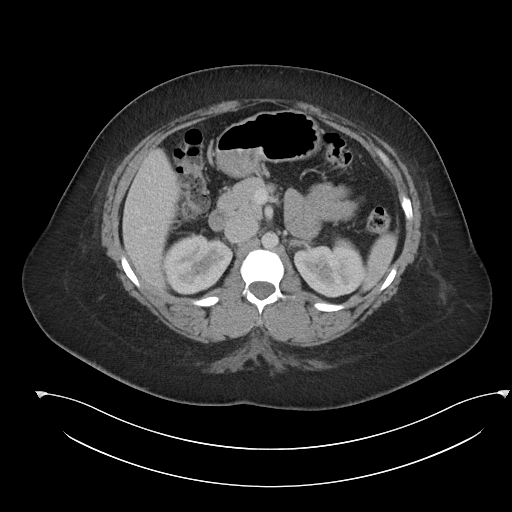
[im 78/98  soft-tissue]
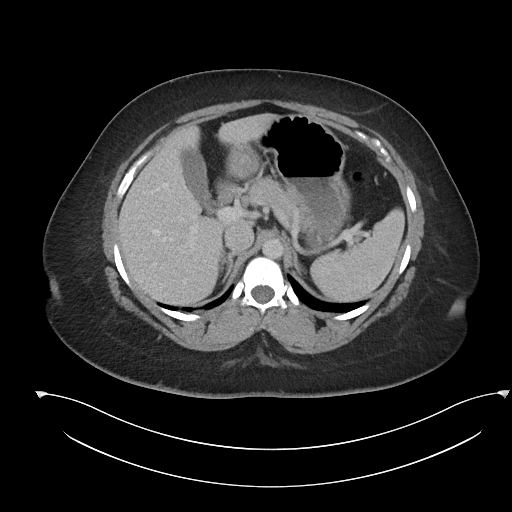
[im 85/98  soft-tissue]
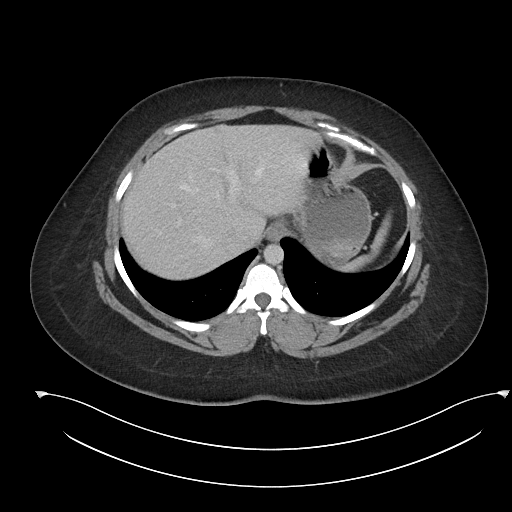
[im 91/98  soft-tissue]
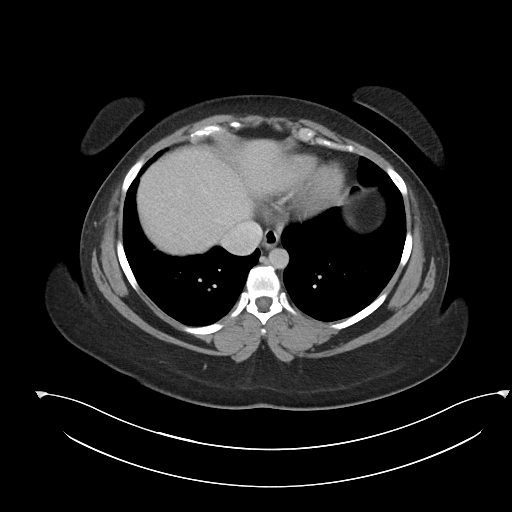

[Series 6: abdomen 3.0 mpr cor · coronal · 0.98mm/px · 3 of 113 slices shown]
[im 38/113  soft-tissue]
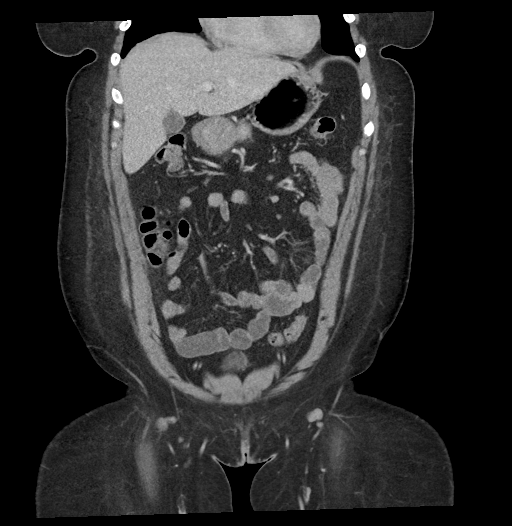
[im 50/113  soft-tissue]
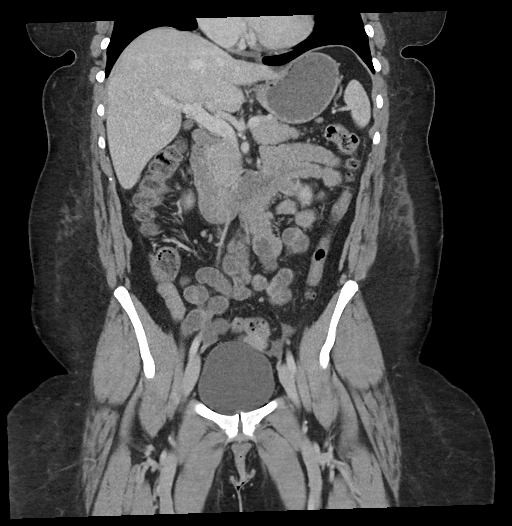
[im 63/113  soft-tissue]
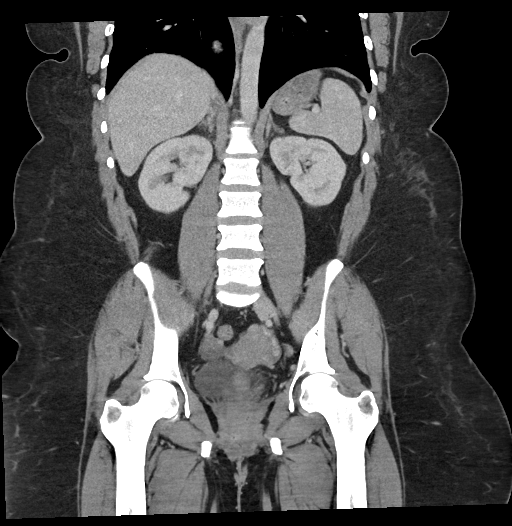

[16 of 46 positions shown; findings below may reference images not displayed]

FINDINGS: Lower chest: Lung bases are clear.

Hepatobiliary: No focal liver abnormality is seen. No gallstones,
gallbladder wall thickening, or biliary dilatation.

Pancreas: No ductal dilatation or inflammation.

Spleen: Normal in size without focal abnormality.

Adrenals/Urinary Tract: Normal adrenal glands. No hydronephrosis or
perinephric edema. Homogeneous renal enhancement with symmetric
excretion on delayed phase imaging. Urinary bladder is
physiologically distended without wall thickening.

Stomach/Bowel: Stomach is within normal limits. Appendix appears
normal. No evidence of bowel wall thickening, distention, or
inflammatory changes.

Vascular/Lymphatic: No significant vascular findings are present. No
enlarged abdominal or pelvic lymph nodes.

Reproductive: Uterus and right ovary are unremarkable. In probable
corpus luteal cyst in the left ovary. Small amount of free fluid in
the pelvis.

Other: Fat containing paraumbilical hernia just superior to the
umbilicus is again seen. Slight increased fluid in the hernia sac
with diminished soft tissue stranding. No bowel involvement. Small
volume simple free fluid in the pelvis is likely physiologic. No
upper abdominal ascites. No free air.

Musculoskeletal: There are no acute or suspicious osseous
abnormalities.
IMPRESSION: 1. Fat containing paraumbilical hernia just superior to the
umbilicus, similar in size over the past 2 weeks. Slight increased
fluid in the hernia sac with diminished soft tissue stranding. No
bowel involvement.
2. Small volume simple free fluid in the pelvis is likely
physiologic. Probable corpus luteal cyst in the left ovary.

## 2019-11-01 ENCOUNTER — Other Ambulatory Visit: Payer: Self-pay

## 2019-11-01 ENCOUNTER — Encounter: Payer: Self-pay | Admitting: Surgery

## 2019-11-01 ENCOUNTER — Ambulatory Visit (INDEPENDENT_AMBULATORY_CARE_PROVIDER_SITE_OTHER): Payer: Self-pay | Admitting: Surgery

## 2019-11-01 VITALS — BP 104/71 | HR 71 | Temp 98.1°F | Resp 12 | Wt 242.6 lb

## 2019-11-01 DIAGNOSIS — Z09 Encounter for follow-up examination after completed treatment for conditions other than malignant neoplasm: Secondary | ICD-10-CM

## 2019-11-01 MED ORDER — OXYCODONE-ACETAMINOPHEN 5-325 MG PO TABS: 1.0000 | ORAL_TABLET | ORAL | 0 refills | Status: AC | PRN

## 2019-11-01 MED ORDER — CYCLOBENZAPRINE HCL 5 MG PO TABS: 5.0000 mg | ORAL_TABLET | Freq: Three times a day (TID) | ORAL | Status: AC

## 2019-11-01 NOTE — Patient Instructions (Signed)
Please pick up your prescription at your local pharmacy.   Please see your appointment below.

## 2019-11-01 NOTE — Progress Notes (Signed)
Persistent pain Gabapentin not working Taking po, muscle spasm  PE NAD OAC:ZYSA, no recurrence or infection , some induration not unexpected from post op cahnges  A/p Pain is the main issues I will prescribe one last refill of percocet and will add flexeril RTC virtual in 2 weeks

## 2019-11-02 ENCOUNTER — Telehealth: Payer: Self-pay | Admitting: *Deleted

## 2019-11-02 NOTE — Telephone Encounter (Signed)
Katrina from [facility] [facility] contacted our office stating that the prescription sent over for patient was a higher dosage of Perocet. Per Katrina was calling to see if they could alter the directions for the prescriptions as their protocol is only allowing patient to take max of 6 pills a day and the way the prescription was written patient would have taken up to 12 tablets a day and she didn't want patient to abuse the prescription. I gave her the OK to alter the directions. Katrina verbalized understanding and had no further questions.

## 2019-11-02 NOTE — Telephone Encounter (Signed)
Patient called and stated that she went to her pharmacy yesterday to pick up her Hydrocodone but they would not fill it. She stated that the pharmacy was going to call her yesterday but we was closed. Patient wants to know what she needs to do

## 2019-11-08 ENCOUNTER — Encounter: Payer: Self-pay | Admitting: Surgery

## 2019-11-15 ENCOUNTER — Other Ambulatory Visit: Payer: Self-pay

## 2019-11-15 ENCOUNTER — Telehealth (INDEPENDENT_AMBULATORY_CARE_PROVIDER_SITE_OTHER): Payer: Self-pay | Admitting: Surgery

## 2019-11-15 DIAGNOSIS — Z09 Encounter for follow-up examination after completed treatment for conditions other than malignant neoplasm: Secondary | ICD-10-CM

## 2019-11-15 NOTE — Progress Notes (Signed)
I called Pt 's cell phone three times at different times. Left voicemail. No answer Last visit she allegedly had pain issues but after I was done with my office she was standing outside our building very comfortable and on her cellphone.

## 2019-12-02 IMAGING — CR DG CHEST 2V
2 series · 2 of 2 positions shown · non-contrast
Comparison: December 10, 2018

CLINICAL DATA: Chest pain

EXAM:
CHEST - 2 VIEW

[chest pa]
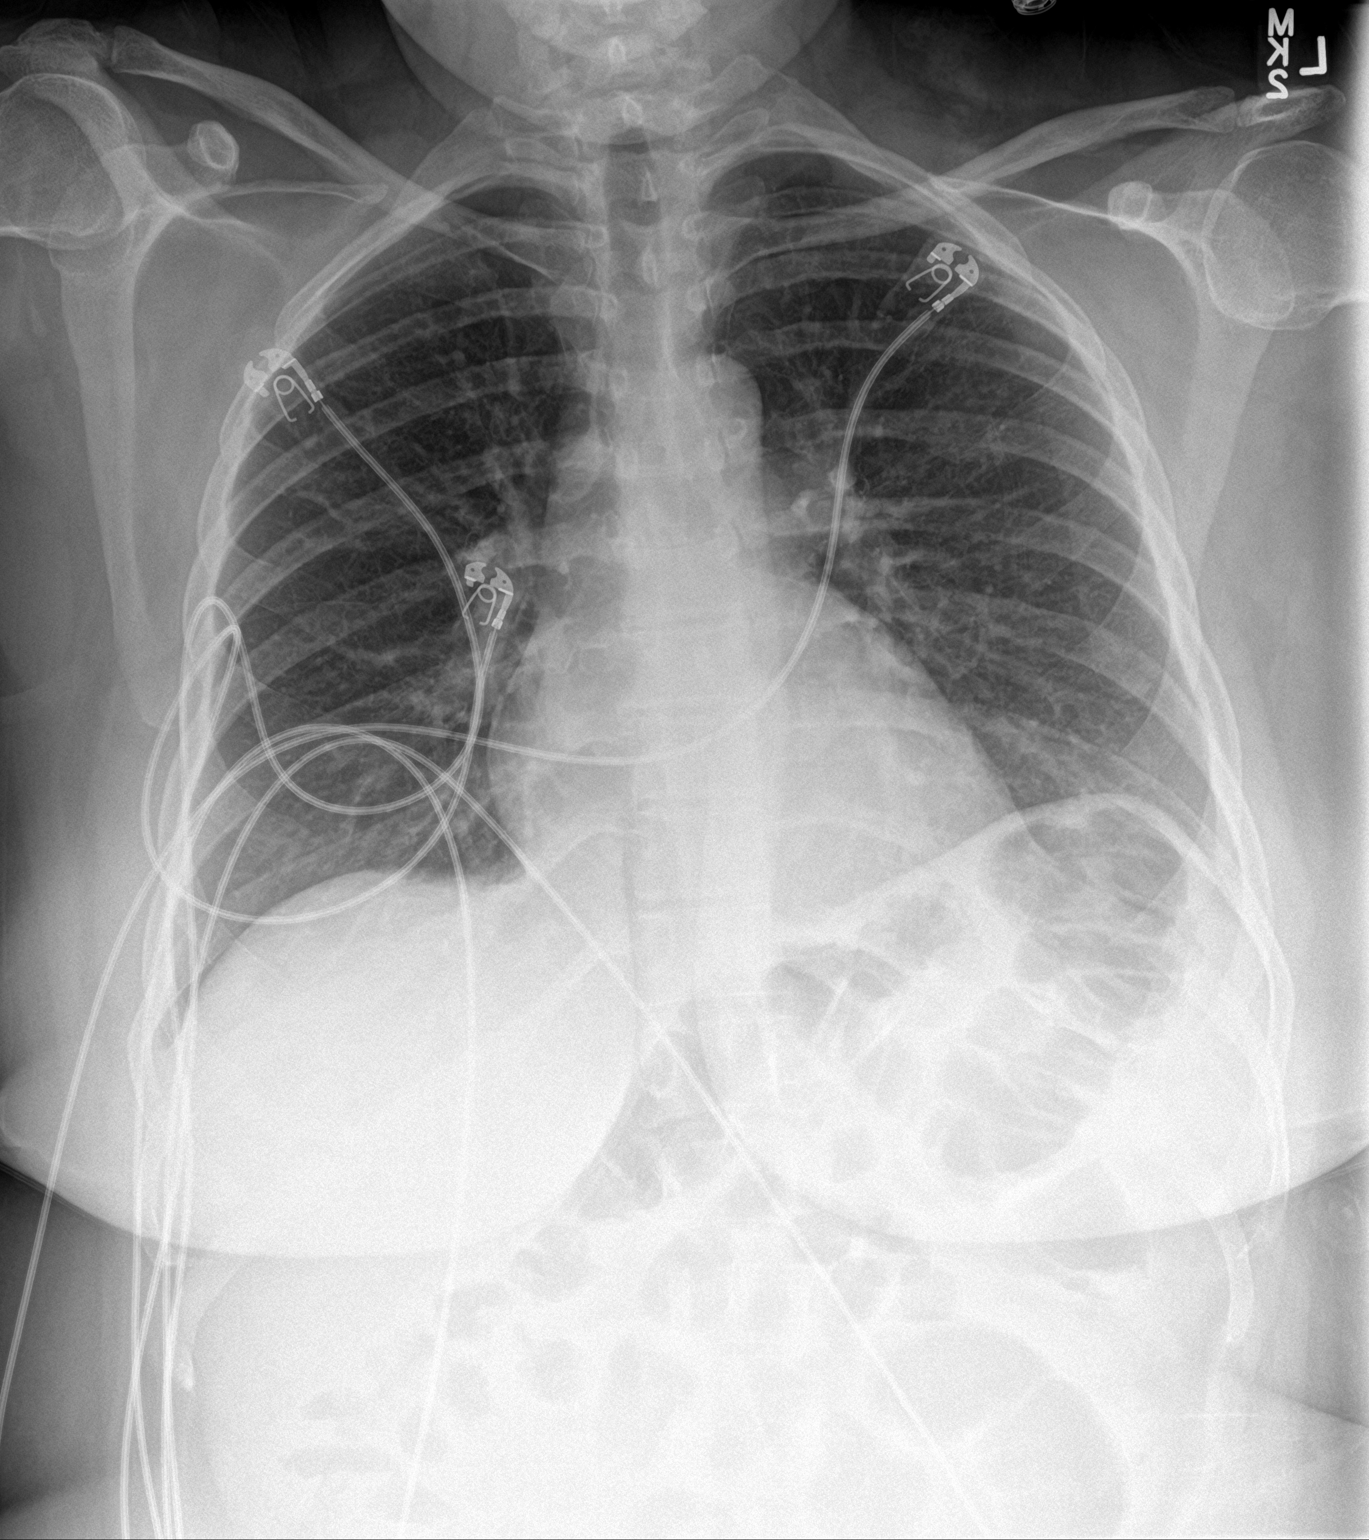

[chest lat]
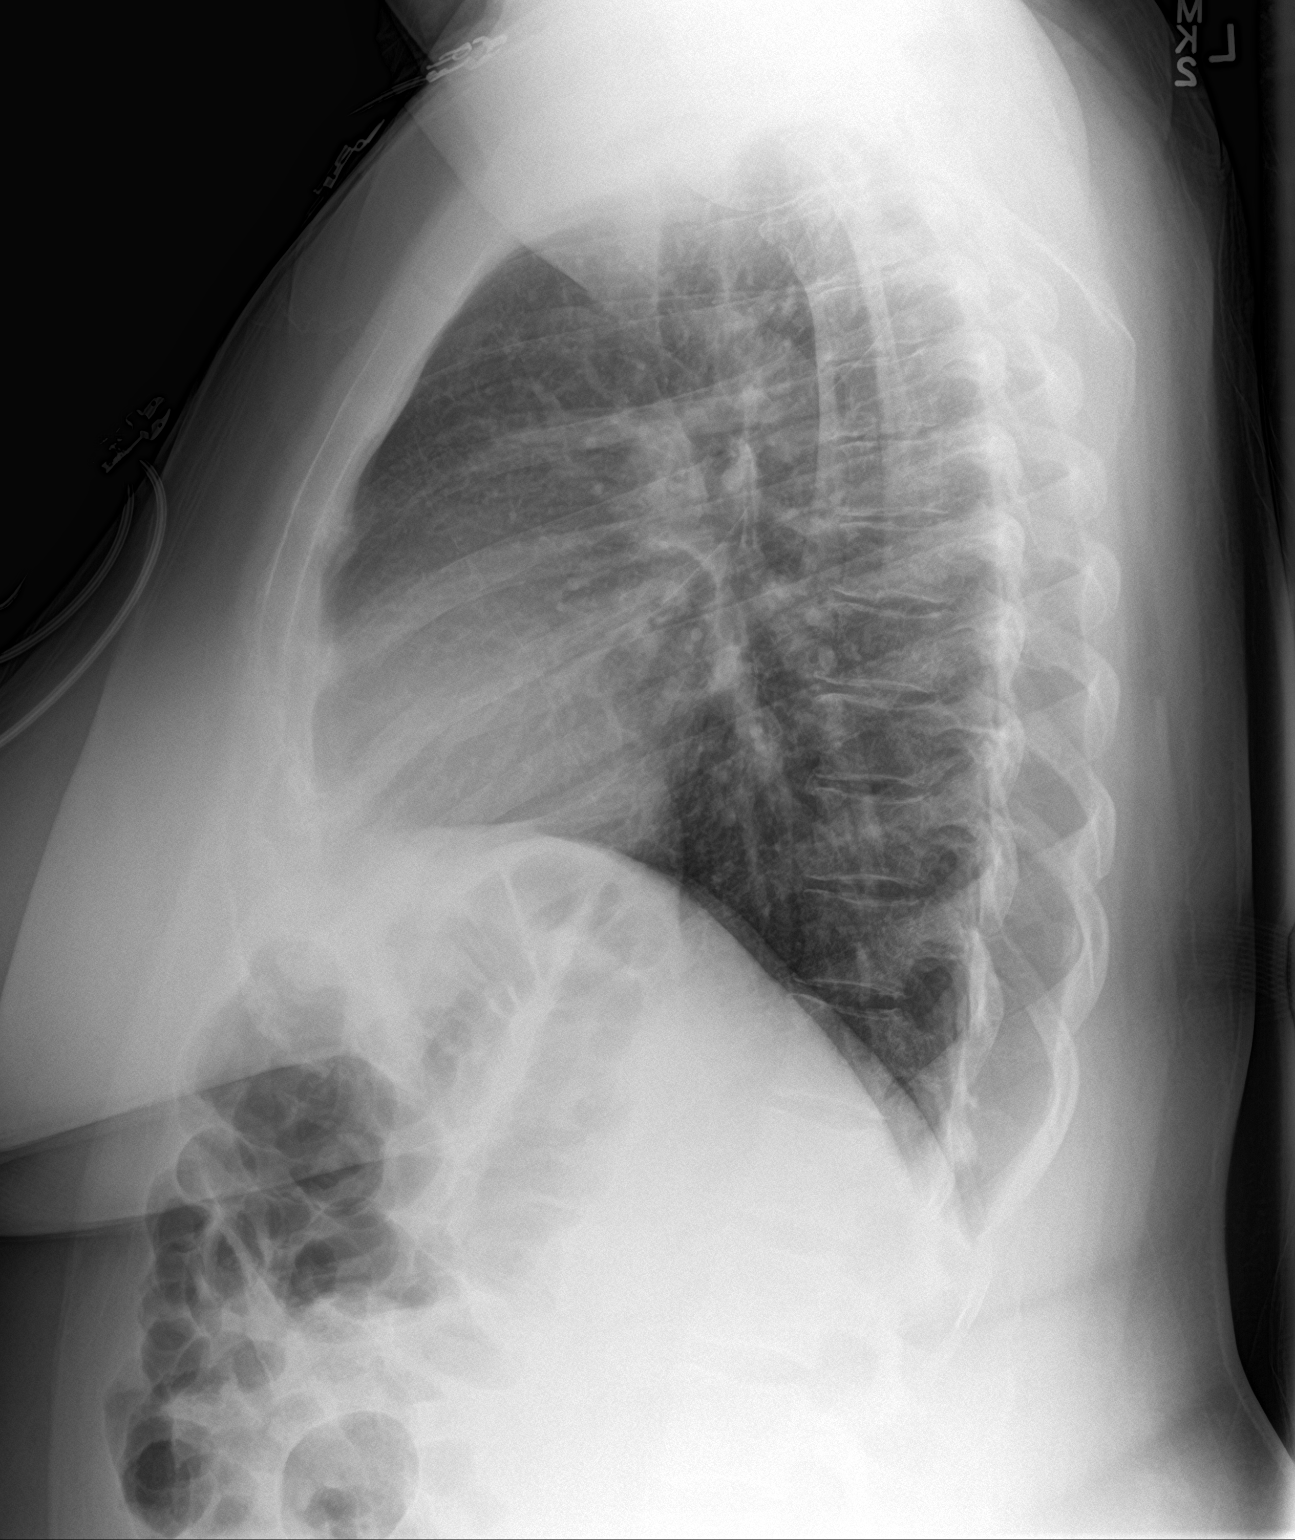

[2 of 2 positions shown; findings below may reference images not displayed]

FINDINGS: Lungs are clear. Heart size and pulmonary vascularity are normal. No
adenopathy. No pneumothorax. No bone lesions.
IMPRESSION: No edema or consolidation.

## 2019-12-02 IMAGING — US US ABDOMEN LIMITED
1 series · 14 of 25 positions shown · non-contrast
Comparison: Right upper quadrant ultrasound December 10, 2018; CT
abdomen and pelvis March 22, 2019

CLINICAL DATA: Upper abdominal pain

EXAM:
ULTRASOUND ABDOMEN LIMITED RIGHT UPPER QUADRANT

[Series 1: us abdomen limited · 14 of 49 slices shown]
[im 1/49]
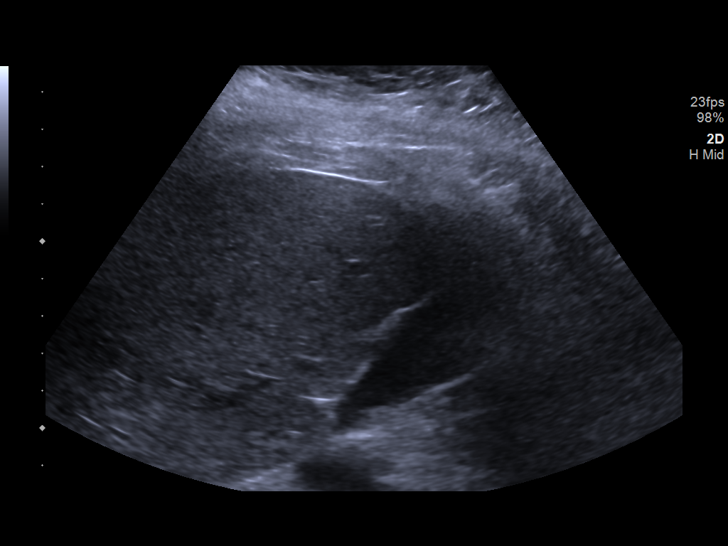
[im 5/49]
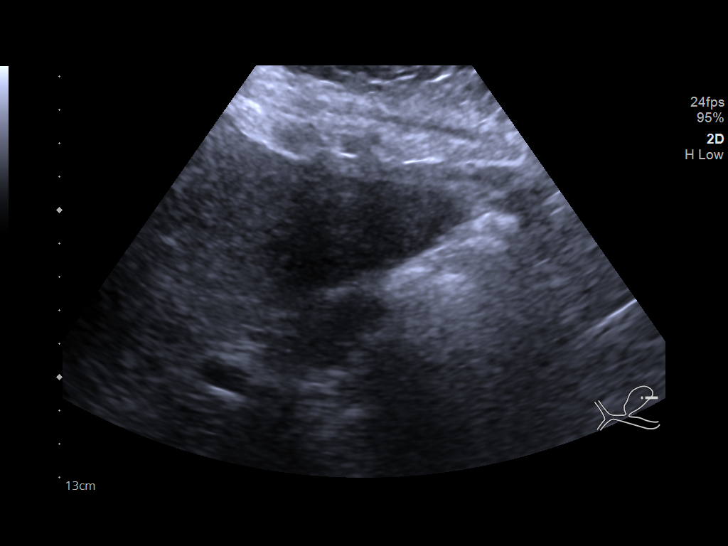
[im 9/49]
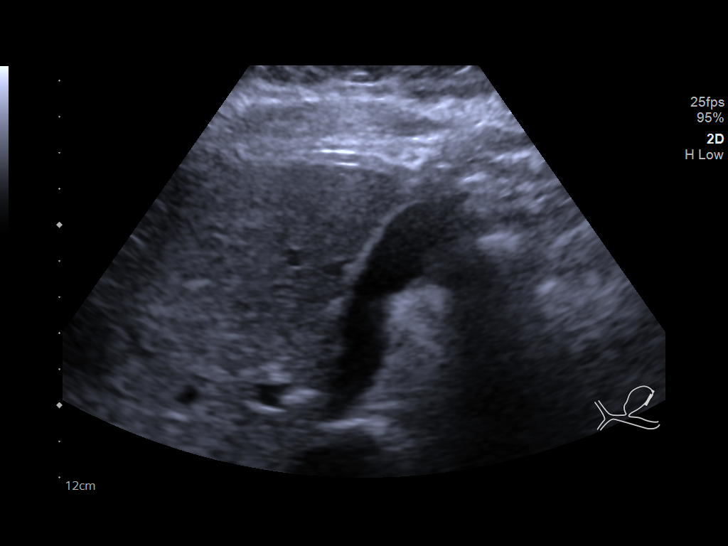
[im 13/49]
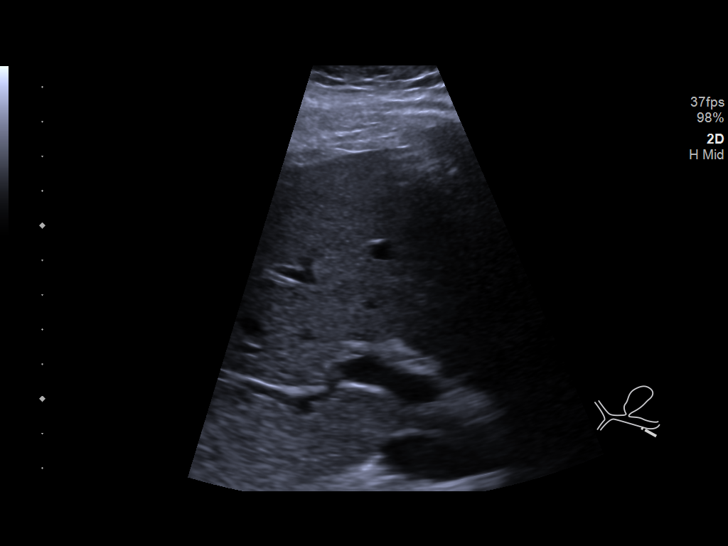
[im 17/49]
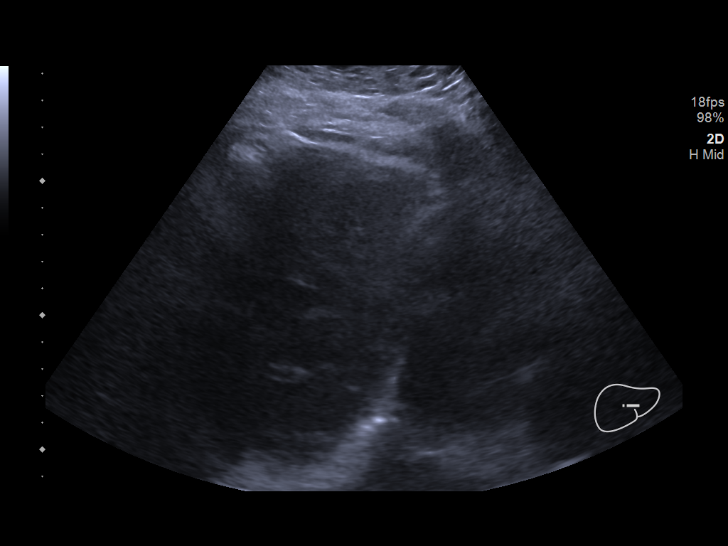
[im 19/49]
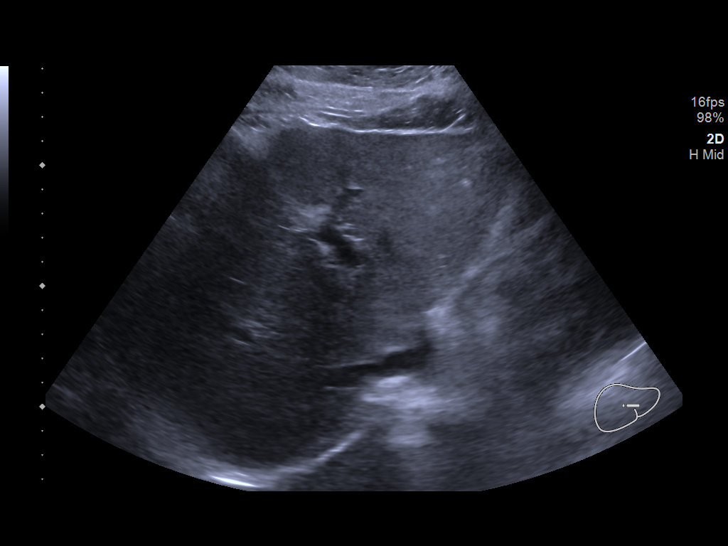
[im 23/49]
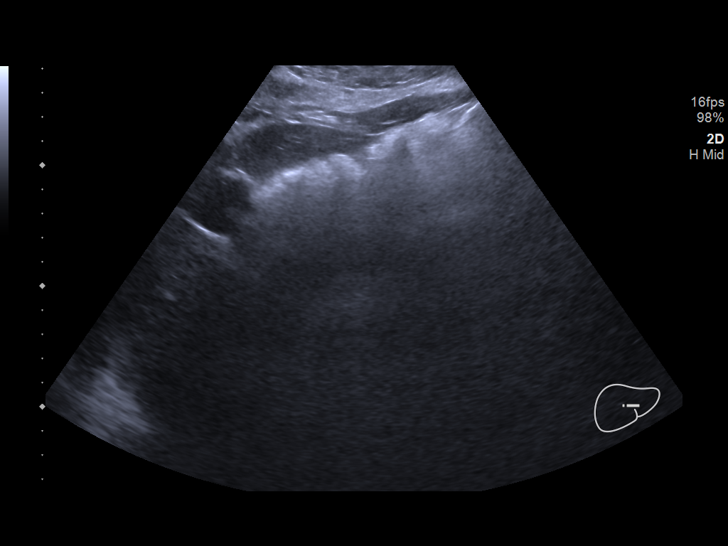
[im 27/49]
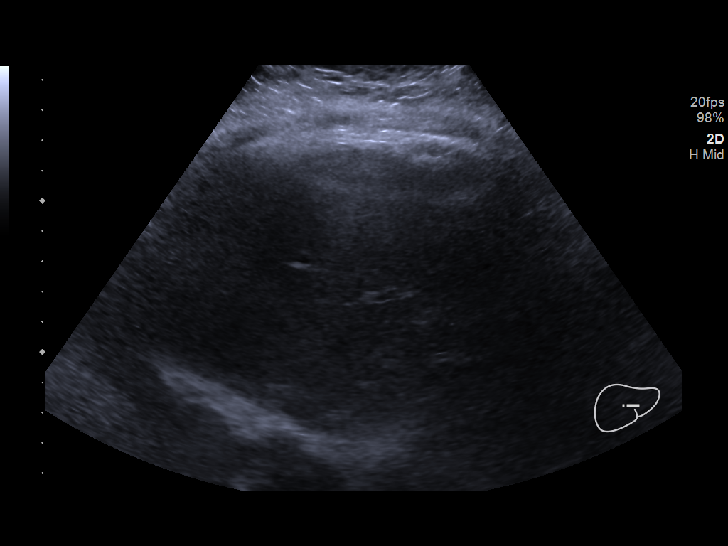
[im 31/49]
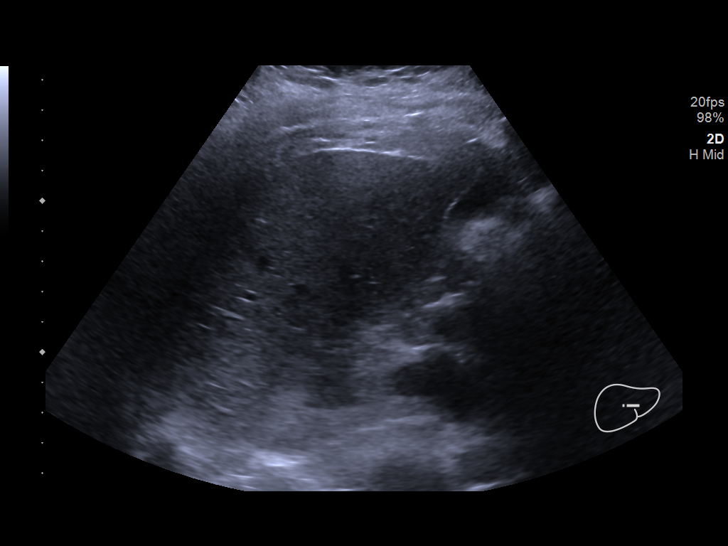
[im 33/49]
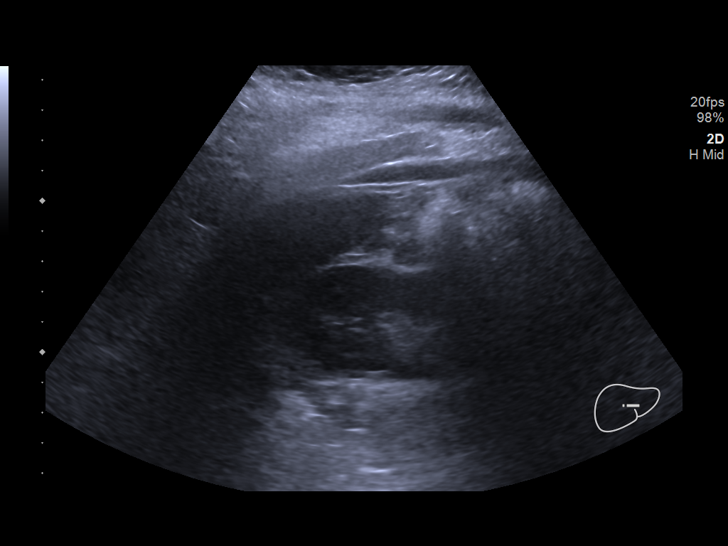
[im 37/49]
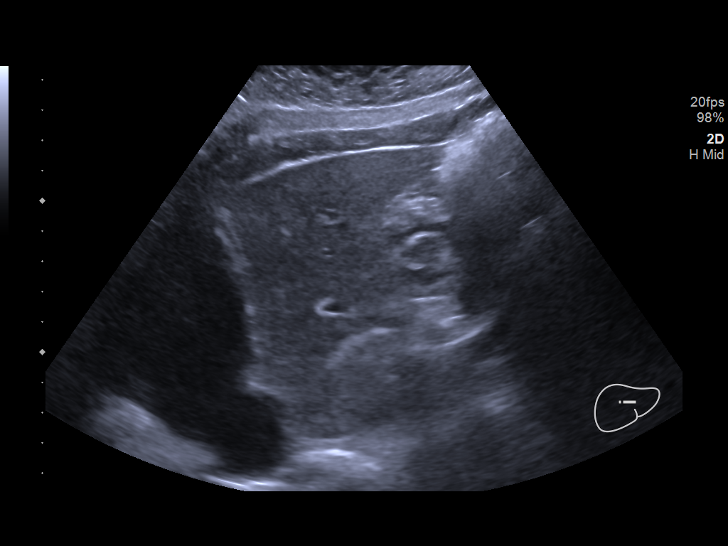
[im 41/49]
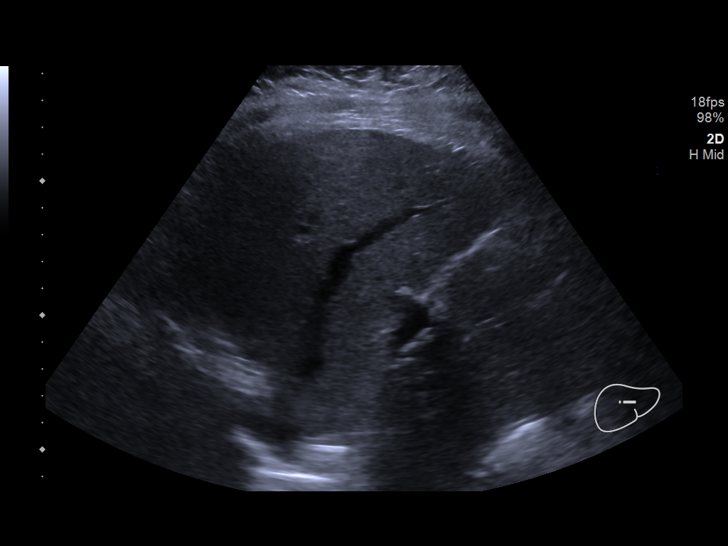
[im 45/49]
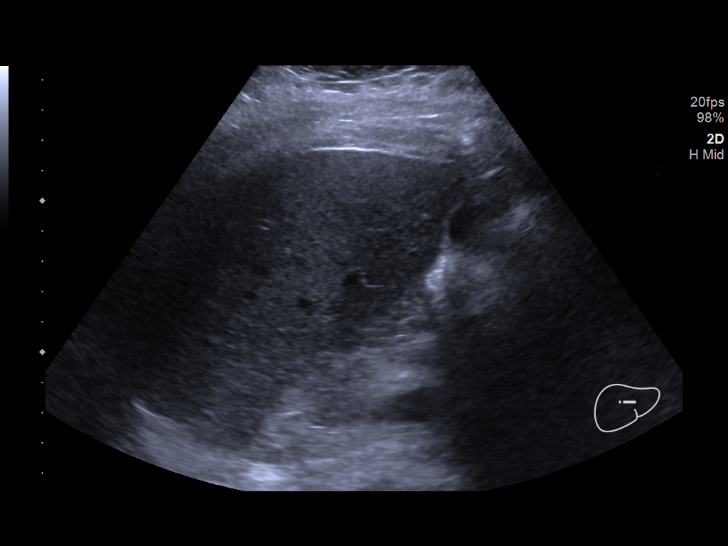
[im 49/49]
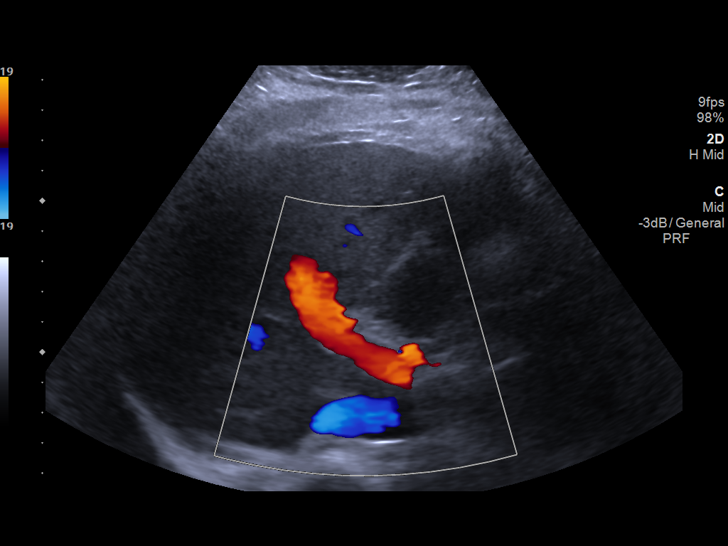

[14 of 25 positions shown; findings below may reference images not displayed]

FINDINGS: Gallbladder:

No gallstones or wall thickening visualized. No pericholecystic
fluid. No sonographic Murphy sign noted by sonographer.

Common bile duct:

Diameter: 2 mm. No intrahepatic or extrahepatic biliary duct
dilatation.

Liver:

No focal lesion identified. Liver echogenicity overall is increased.
Portal vein is patent on color Doppler imaging with normal direction
of blood flow towards the liver.

Other: None.
IMPRESSION: Increase in liver echogenicity, a finding indicative of hepatic
steatosis. No focal liver lesions evident. Study otherwise
unremarkable.

## 2019-12-28 ENCOUNTER — Ambulatory Visit: Payer: Self-pay | Admitting: Family Medicine

## 2019-12-29 ENCOUNTER — Encounter: Payer: Self-pay | Admitting: Internal Medicine

## 2019-12-29 ENCOUNTER — Other Ambulatory Visit: Payer: Self-pay

## 2019-12-29 ENCOUNTER — Ambulatory Visit: Payer: Self-pay | Attending: Family Medicine | Admitting: Family

## 2019-12-29 VITALS — BP 120/83 | HR 75 | Temp 97.0°F | Resp 16 | Wt 252.2 lb

## 2019-12-29 DIAGNOSIS — K219 Gastro-esophageal reflux disease without esophagitis: Secondary | ICD-10-CM

## 2019-12-29 DIAGNOSIS — R1310 Dysphagia, unspecified: Secondary | ICD-10-CM

## 2019-12-29 DIAGNOSIS — R070 Pain in throat: Secondary | ICD-10-CM

## 2019-12-29 DIAGNOSIS — R5383 Other fatigue: Secondary | ICD-10-CM

## 2019-12-29 MED ORDER — OMEPRAZOLE 20 MG PO CPDR: 20.0000 mg | DELAYED_RELEASE_CAPSULE | Freq: Every day | ORAL | 3 refills | Status: AC

## 2019-12-29 NOTE — Patient Instructions (Addendum)
Lab today. Referral to ENT. Omeprazole added for GERD. Fatigue If you have fatigue, you feel tired all the time and have a lack of energy or a lack of motivation. Fatigue may make it difficult to start or complete tasks because of exhaustion. In general, occasional or mild fatigue is often a normal response to activity or life. However, long-lasting (chronic) or extreme fatigue may be a symptom of a medical condition. Follow these instructions at home: General instructions  Watch your fatigue for any changes.  Go to bed and get up at the same time every day.  Avoid fatigue by pacing yourself during the day and getting enough sleep at night.  Maintain a healthy weight. Medicines  Take over-the-counter and prescription medicines only as told by your health care provider.  Take a multivitamin, if told by your health care provider.  Do not use herbal or dietary supplements unless they are approved by your health care provider. Activity   Exercise regularly, as told by your health care provider.  Use or practice techniques to help you relax, such as yoga, tai chi, meditation, or massage therapy. Eating and drinking   Avoid heavy meals in the evening.  Eat a well-balanced diet, which includes lean proteins, whole grains, plenty of fruits and vegetables, and low-fat dairy products.  Avoid consuming too much caffeine.  Avoid the use of alcohol.  Drink enough fluid to keep your urine pale yellow. Lifestyle  Change situations that cause you stress. Try to keep your work and personal schedule in balance.  Do not use any products that contain nicotine or tobacco, such as cigarettes and e-cigarettes. If you need help quitting, ask your health care provider.  Do not use drugs. Contact a health care provider if:  Your fatigue does not get better.  You have a fever.  You suddenly lose or gain weight.  You have headaches.  You have trouble falling asleep or sleeping through the  night.  You feel angry, guilty, anxious, or sad.  You are unable to have a bowel movement (constipation).  Your skin is dry.  You have swelling in your legs or another part of your body. Get help right away if:  You feel confused.  Your vision is blurry.  You feel faint or you pass out.  You have a severe headache.  You have severe pain in your abdomen, your back, or the area between your waist and hips (pelvis).  You have chest pain, shortness of breath, or an irregular or fast heartbeat.  You are unable to urinate, or you urinate less than normal.  You have abnormal bleeding, such as bleeding from the rectum, vagina, nose, lungs, or nipples.  You vomit blood.  You have thoughts about hurting yourself or others. If you ever feel like you may hurt yourself or others, or have thoughts about taking your own life, get help right away. You can go to your nearest emergency department or call:  Your local emergency services (911 in the [place]  A suicide crisis helpline, such as the National Suicide Prevention Lifeline at 1-800-273-8255. This is open 24 hours a day. Summary  If you have fatigue, you feel tired all the time and have a lack of energy or a lack of motivation.  Fatigue may make it difficult to start or complete tasks because of exhaustion.  Long-lasting (chronic) or extreme fatigue may be a symptom of a medical condition.  Exercise regularly, as told by your health care provider.  Change   situations that cause you stress. Try to keep your work and personal schedule in balance. This information is not intended to replace advice given to you by your health care provider. Make sure you discuss any questions you have with your health care provider. Document Revised: 01/04/2019 Document Reviewed: 03/10/2017 Elsevier Patient Education  2020 Elsevier Inc.  

## 2019-12-29 NOTE — Progress Notes (Signed)
Patient ID: ADDYSYN FERN, female    DOB: 04-07-1981  MRN: [id number]  CC: Chronic Conditions  Subjective: Theresa Burke is a 39 y.o. female with history of anxiety and GERD who presents for chronic conditions.   1. FATIGUE: Duration:  months Severity: 10/10  Onset: sudden Context when symptoms started:  unknown Symptoms improve with rest: yes  Depressive symptoms: no Stress/anxiety: no Insomnia: no hard to stay asleep Snoring: no Daytime hypersomnolence:yes Wakes feeling refreshed: no Dysnea on exertion:  no Orthopnea/PND: using 4 pillows to sleep at night and reports this is related to a hernia surgery she had in April 2021 Chest pain: no Chronic cough: yes Lower extremity edema: no Arthralgias:no Myalgias: no Weakness: no Rash: no Reports she is sleeping during the day about 3 hours and sleeping at night about 3 hours.   2. BURNING SENSATION THROAT: Reports she has a burning sensation in throat for about 1 month.  Endorses decreased appetite. Reports she is able to eat solid foods and drinking a lot of water and tea. Denies specific foods and or drinks aggravating the throat. Does have history of acid reflux and reports she is no longer taking the medication as she feels like she does not need it. Reports she does a lot of spitting as well.   Last visit 07/12/2019 with Dr. Chapman Fitch. During that encounter patient referred to Gastroenterology. H. Pylori, CBC, and lipase obtained. Today patient reports she went to Gastroenterology and they told she was fine.  Patient Active Problem List   Diagnosis Date Noted  . Chest wall pain 12/01/2018     Current Outpatient Medications on File Prior to Visit  Medication Sig Dispense Refill  . acetaminophen (TYLENOL) 500 MG tablet Take 1,000 mg by mouth every 8 (eight) hours as needed for moderate pain or headache.     . esomeprazole (NEXIUM) 40 MG capsule Take 1 capsule (40 mg total) by mouth 2 (two) times daily. 180 capsule 1  .  gabapentin (NEURONTIN) 300 MG capsule Take 1 capsule (300 mg total) by mouth 3 (three) times daily. 30 capsule 0  . HYDROcodone-acetaminophen (NORCO/VICODIN) 5-325 MG tablet Take 1-2 tablets by mouth every 4 (four) hours as needed for moderate pain. (Patient not taking: Reported on 10/25/2019) 20 tablet 0  . ibuprofen (ADVIL) 800 MG tablet Take 1 tablet (800 mg total) by mouth every 8 (eight) hours as needed. 30 tablet 0  . sucralfate (CARAFATE) 1 GM/10ML suspension Take 10 mLs (1 g total) by mouth 4 (four) times daily -  with meals and at bedtime. 420 mL 0   No current facility-administered medications on file prior to visit.    No Known Allergies  Social History   Socioeconomic History  . Marital status: Single    Spouse name: Not on file  . Number of children: Not on file  . Years of education: Not on file  . Highest education level: Not on file  Occupational History  . Not on file  Tobacco Use  . Smoking status: Former Smoker    Quit date: 10/08/2017    Years since quitting: 2.2  . Smokeless tobacco: Never Used  Vaping Use  . Vaping Use: Never used  Substance and Sexual Activity  . Alcohol use: Yes    Comment: social  . Drug use: Yes    Types: Marijuana  . Sexual activity: Yes    Birth control/protection: Other-see comments  Other Topics Concern  . Not on file  Social History  Narrative   ** Merged History Encounter **       Social Determinants of Health   Financial Resource Strain:   . Difficulty of Paying Living Expenses:   Food Insecurity:   . Worried About Charity fundraiser in the Last Year:   . Arboriculturist in the Last Year:   Transportation Needs:   . Film/video editor (Medical):   Marland Kitchen Lack of Transportation (Non-Medical):   Physical Activity:   . Days of Exercise per Week:   . Minutes of Exercise per Session:   Stress:   . Feeling of Stress :   Social Connections:   . Frequency of Communication with Friends and Family:   . Frequency of Social  Gatherings with Friends and Family:   . Attends Religious Services:   . Active Member of Clubs or Organizations:   . Attends Archivist Meetings:   Marland Kitchen Marital Status:   Intimate Partner Violence:   . Fear of Current or Ex-Partner:   . Emotionally Abused:   Marland Kitchen Physically Abused:   . Sexually Abused:     Family History  Problem Relation Age of Onset  . Cancer Mother   . Cervical cancer Mother   . Healthy Father   . Other Other        doesn't really know - but no h/o CAD, HLD HTN or DM.   Marland Kitchen Hypertension Sister     Past Surgical History:  Procedure Laterality Date  . BIOPSY  08/25/2019   Procedure: BIOPSY;  Surgeon: Wonda Horner, MD;  Location: [facility];  Service: Endoscopy;;  . ESOPHAGOGASTRODUODENOSCOPY (EGD) WITH PROPOFOL N/A 08/25/2019   Procedure: ESOPHAGOGASTRODUODENOSCOPY (EGD) WITH PROPOFOL;  Surgeon: Wonda Horner, MD;  Location: [facility];  Service: Endoscopy;  Laterality: N/A;  . NO PAST SURGERIES    . XI ROBOTIC ASSISTED VENTRAL HERNIA N/A 10/12/2019   Procedure: XI ROBOTIC ASSISTED VENTRAL HERNIA;  Surgeon: Jules Husbands, MD;  Location: [facility];  Service: General;  Laterality: N/A;    ROS: Review of Systems Negative except as stated above  PHYSICAL EXAM: BP 120/83   Pulse 75   Temp (!) 97 F (36.1 C)   Resp 16   Wt 252 lb 3.2 oz (114.4 kg)   SpO2 98%   BMI 43.29 kg/m   Physical Exam General appearance - alert, well appearing, and in no distress, oriented to person, place, and time and overweight Mental status - alert, oriented to person, place, and time, anxious Eyes - pupils equal and reactive, extraocular eye movements intact Ears - bilateral TM's and external ear canals normal Nose - normal and patent, no erythema, discharge or polyps and normal nontender sinuses Mouth - mucous membranes moist, pharynx normal without lesions Neck - supple, no significant adenopathy Lymphatics - no palpable lymphadenopathy, no  hepatosplenomegaly Chest - clear to auscultation, no wheezes, rales or rhonchi, symmetric air entry, no tachypnea, retractions or cyanosis Heart - normal rate, regular rhythm, normal S1, S2, no murmurs, rubs, clicks or gallops Neurological - alert, oriented, normal speech, no focal findings or movement disorder noted, neck supple without rigidity, cranial nerves II through XII intact, DTR's normal and symmetric, motor and sensory grossly normal bilaterally, normal muscle tone, no tremors, strength 5/5, Romberg sign negative, normal gait and station  ASSESSMENT AND PLAN: 1. Fatigue, unspecified type: -Patient presents today with fatigue which began 1 month ago.  -TSH to check thyroid function.  -Hemoglobin A1C to screen for prediabetes/diabetes. -  CBC to check blood count. -CMP to check kidney function, liver function, and electrolyte balance. - TSH - Hemoglobin A1c - CBC With Differential - CMP14+EGFR  2. Dysphagia, unspecified type: -Patient with history of difficulty of swallowing. Reports she is able to eat and swallow solid foods and drink as normal but it is still somewhat difficult to swallow as normal. -Patient referred to Gastroenterology on 07/12/2019 by her primary physician.  -Last visit with Gastroenterology on 2/26/2021with Dr. Penelope Coop. At that time patient had a barium swallow done which did not reveal any stricture. When the film was evaluated there was evidence of some narrowing distally. An EGD with possible dilation was ordered at that time. -EGD resulted with a single erosion in the middle third of the esophagus. Biopsied. Normal stomach. Normal examined duodenum. Recommendation to resume previous diet and medications.  -Referral to ENT for further evaluation and management. - Ambulatory referral to ENT  3. Burning sensation of throat: -Patient with concern of burning throat sensation for at least 1 month. Reports she is able to eat and drink as normal. Denies any specific  food and drink aggravating factors. Reports this sensation lasts all day.  -Patient with history of GERD. Patient reports she is no longer taking this medication because she feels her GERD is under control. -Burning sensation in throat seems likely related to GERD. -Esomeprazole discontinued. -Added Omeprazole daily to be used to assist with management. -Referral to ENT for further evaluation and management. - omeprazole (PRILOSEC) 20 MG capsule; Take 1 capsule (20 mg total) by mouth daily.  Dispense: 30 capsule; Refill: 3 - Ambulatory referral to ENT  4. Gastroesophageal reflux disease, unspecified whether esophagitis present: -Patient with history of GERD. Patient reports she is no longer taking this medication because she feels her GERD is under control. -Esomeprazole discontinued. -Added Omeprazole daily to be used to assist with management. -Referral to ENT for further evaluation and management. -Follow-up with primary physician as needed. - omeprazole (PRILOSEC) 20 MG capsule; Take 1 capsule (20 mg total) by mouth daily.  Dispense: 30 capsule; Refill: 3  Patient was given the opportunity to ask questions.  Patient verbalized understanding of the plan and was able to repeat key elements of the plan.   Camillia Herter, NP

## 2019-12-30 LAB — CBC WITH DIFFERENTIAL
Basophils Absolute: 0 10*3/uL (ref 0.0–0.2)
Basos: 1 %
EOS (ABSOLUTE): 0 10*3/uL (ref 0.0–0.4)
Eos: 1 %
Hematocrit: 39 % (ref 34.0–46.6)
Hemoglobin: 12.4 g/dL (ref 11.1–15.9)
Immature Grans (Abs): 0 10*3/uL (ref 0.0–0.1)
Immature Granulocytes: 0 %
Lymphocytes Absolute: 1.4 10*3/uL (ref 0.7–3.1)
Lymphs: 30 %
MCH: 29.5 pg (ref 26.6–33.0)
MCHC: 31.8 g/dL (ref 31.5–35.7)
MCV: 93 fL (ref 79–97)
Monocytes Absolute: 0.3 10*3/uL (ref 0.1–0.9)
Monocytes: 7 %
Neutrophils Absolute: 2.9 10*3/uL (ref 1.4–7.0)
Neutrophils: 61 %
RBC: 4.21 x10E6/uL (ref 3.77–5.28)
RDW: 12.2 % (ref 11.7–15.4)
WBC: 4.7 10*3/uL (ref 3.4–10.8)

## 2019-12-30 LAB — CMP14+EGFR
ALT: 13 IU/L (ref 0–32)
AST: 11 IU/L (ref 0–40)
Albumin/Globulin Ratio: 1.3 (ref 1.2–2.2)
Albumin: 4 g/dL (ref 3.8–4.8)
Alkaline Phosphatase: 145 IU/L — ABNORMAL HIGH (ref 48–121)
BUN/Creatinine Ratio: 18 (ref 9–23)
BUN: 12 mg/dL (ref 6–24)
Bilirubin Total: 0.3 mg/dL (ref 0.0–1.2)
CO2: 24 mmol/L (ref 20–29)
Calcium: 9.3 mg/dL (ref 8.7–10.2)
Chloride: 102 mmol/L (ref 96–106)
Creatinine, Ser: 0.66 mg/dL (ref 0.57–1.00)
GFR calc Af Amer: 127 mL/min/{1.73_m2} (ref >59)
GFR calc non Af Amer: 110 mL/min/{1.73_m2} (ref >59)
Globulin, Total: 3.2 g/dL (ref 1.5–4.5)
Glucose: 84 mg/dL (ref 65–99)
Potassium: 4.4 mmol/L (ref 3.5–5.2)
Sodium: 139 mmol/L (ref 134–144)
Total Protein: 7.2 g/dL (ref 6.0–8.5)

## 2019-12-30 LAB — HEMOGLOBIN A1C
Est. average glucose Bld gHb Est-mCnc: 105 mg/dL
Hgb A1c MFr Bld: 5.3 % (ref 4.8–5.6)

## 2019-12-30 LAB — TSH: TSH: 2.1 u[IU]/mL (ref 0.450–4.500)

## 2019-12-30 NOTE — Progress Notes (Signed)
Please call patient with update.   Thyroid function normal.   No prediabetes/diabetes.   Kidney function normal.   Liver function normal.   No anemia.   Omeprazole prescribed for acid reflux and sent to pharmacy on file. Referral to ENT for further evaluation and management submitted.

## 2020-01-03 ENCOUNTER — Telehealth: Payer: Self-pay | Admitting: Family Medicine

## 2020-01-03 NOTE — Telephone Encounter (Signed)
Returned pt call to go over lab results left a detailed vm informing pt of results and made her aware that ENT referral has been placed and omeprazole has been sent to the pharmacy and if she has any questions or concerns to give a call

## 2020-01-03 NOTE — Telephone Encounter (Signed)
Please f/u   Copied from CRM [id number]. Topic: Quick Communication - See Telephone Encounter >> Dec 29, 2019  4:56 PM Jens Som A wrote: CRM for notification. See Telephone encounter for: 12/29/19.  Patient is calling back she missed a phone call. No voice mail was left

## 2020-01-05 ENCOUNTER — Telehealth: Payer: Self-pay

## 2020-01-05 NOTE — Telephone Encounter (Signed)
Patient name and DOB has been verified Patient was informed of lab results. Patient had no questions.  

## 2020-01-05 NOTE — Telephone Encounter (Signed)
-----   Message from Rema Fendt, NP sent at 12/30/2019 10:49 AM EDT ----- Please call patient with update.   Thyroid function normal.   No prediabetes/diabetes.   Kidney function normal.   Liver function normal.   No anemia.   Omeprazole prescribed for acid reflux and sent to pharmacy on file. Referral to ENT for further evaluation and management submitted.

## 2020-03-07 ENCOUNTER — Ambulatory Visit: Payer: Self-pay

## 2020-03-07 NOTE — Telephone Encounter (Signed)
Patient called stating that several hours after receiving her COVID-19 vaccine Tuesday 03/05/20 she developed swelling redness and pain at the injection site.  She has no fever or other symptoms but the arm is very sore and she states getting worse.  She describes it and an area of redness like a bee sting would cause. No streaks she states it is the size of a quarter. Per protocol patient will go to UC for evaluation of area. Care advice read to patient.  She verbalized understanding.  Reason for Disposition . [1] Redness or red streak around the injection site AND [2] started > 48 hours after getting vaccine AND [3] no fever  (Exception: red area < 1 inch or 2.5 cm wide)  Answer Assessment - Initial Assessment Questions 1. MAIN CONCERN OR SYMPTOM:  "What is your main concern right now?" "What question do you have?" "What's the main symptom you're worried about?" (e.g., fever, pain, redness, swelling)   Red and sore swollen 2. VACCINE: "What vaccination did you receive?" "Is this your first or second shot?" (e.g., none; AstraZeneca, J&J, Moderna, [employer], other)     Pfizer 3. SYMPTOM ONSET: "When did the symptoms begin?" (e.g., not relevant; hours, days)     Hours after had vaccine Tuesday 4. SYMPTOM SEVERITY: "How bad is it?"      Swollen like bee sting 5. FEVER: "Is there a fever?" If so, ask: "What is it, how was it measured, and when did it start?"      no 6. PAST REACTIONS: "Have you reacted to immunizations before?" If so, ask: "What happened?"     no 7. OTHER SYMPTOMS: "Do you have any other symptoms?"    tired  Protocols used: CORONAVIRUS (COVID-19) VACCINE QUESTIONS AND REACTIONS-A-AH

## 2020-03-09 ENCOUNTER — Ambulatory Visit (HOSPITAL_COMMUNITY)
Admission: EM | Admit: 2020-03-09 | Discharge: 2020-03-09 | Disposition: A | Discharge status: Home / Self Care | Payer: Self-pay | Attending: Family Medicine | Admitting: Family Medicine

## 2020-03-09 ENCOUNTER — Other Ambulatory Visit: Payer: Self-pay

## 2020-03-09 DIAGNOSIS — L2389 Allergic contact dermatitis due to other agents: Secondary | ICD-10-CM

## 2020-03-09 MED ORDER — TRIAMCINOLONE ACETONIDE 0.1 % EX CREA: 1.0000 "application " | TOPICAL_CREAM | Freq: Two times a day (BID) | CUTANEOUS | 0 refills | Status: AC

## 2020-03-09 NOTE — Discharge Instructions (Signed)
Apply ice off and on throughout the day for swelling, use the cream until issue is resolved. Take antihistamines 1-2 times daily

## 2020-03-09 NOTE — ED Triage Notes (Signed)
Pt had Pfizer #1 on Tuesday (L arm).  On Wednesday, noticed quarter-sized hives around injection site and a HA.  Hives have improved.  HA remains - Tylenol PM provided some relief.  Injection site still red.  Has had no trouble breathing or felt that airway was impacted at any time.  No CP, SOB.

## 2020-03-09 NOTE — ED Provider Notes (Signed)
[facility]    CSN: [id number] Arrival date & time: 03/09/20  1042      History   Chief Complaint Chief Complaint  Patient presents with  . Allergic Reaction    HPI Theresa Burke is a 39 y.o. female.   Here today with area of erythema, edema and significant itching surrounding COVID injection site to left deltoid the past 2 days. Has also had a headache since vaccine dose. Initially had hives developing in the area but those have since dissipated. Has not tried anything OTC for this issue. Denies throat scratching or swelling, wheezing, chest tightness, SOB. This was her first COVID vaccine.      Past Medical History:  Diagnosis Date  . Anxiety   . GERD (gastroesophageal reflux disease)     Patient Active Problem List   Diagnosis Date Noted  . Chest wall pain 12/01/2018    Past Surgical History:  Procedure Laterality Date  . BIOPSY  08/25/2019   Procedure: BIOPSY;  Surgeon: Graylin Shiver, MD;  Location: [facility];  Service: Endoscopy;;  . ESOPHAGOGASTRODUODENOSCOPY (EGD) WITH PROPOFOL N/A 08/25/2019   Procedure: ESOPHAGOGASTRODUODENOSCOPY (EGD) WITH PROPOFOL;  Surgeon: Graylin Shiver, MD;  Location: [facility];  Service: Endoscopy;  Laterality: N/A;  . NO PAST SURGERIES    . XI ROBOTIC ASSISTED VENTRAL HERNIA N/A 10/12/2019   Procedure: XI ROBOTIC ASSISTED VENTRAL HERNIA;  Surgeon: Leafy Ro, MD;  Location: [facility];  Service: General;  Laterality: N/A;    OB History   No obstetric history on file.      Home Medications    Prior to Admission medications   Medication Sig Start Date End Date Taking? Authorizing Provider  acetaminophen (TYLENOL) 500 MG tablet Take 1,000 mg by mouth every 8 (eight) hours as needed for moderate pain or headache.    Yes [provider]  ibuprofen (ADVIL) 800 MG tablet Take 1 tablet (800 mg total) by mouth every 8 (eight) hours as needed. 10/25/19  Yes Pabon, Diego F, MD  gabapentin (NEURONTIN) 300 MG  capsule Take 1 capsule (300 mg total) by mouth 3 (three) times daily. 10/25/19   Pabon, Merri Ray, MD  HYDROcodone-acetaminophen (NORCO/VICODIN) 5-325 MG tablet Take 1-2 tablets by mouth every 4 (four) hours as needed for moderate pain. Patient not taking: Reported on 10/25/2019 10/20/19   Donovan Kail, PA-C  omeprazole (PRILOSEC) 20 MG capsule Take 1 capsule (20 mg total) by mouth daily. 12/29/19   Rema Fendt, NP  sucralfate (CARAFATE) 1 GM/10ML suspension Take 10 mLs (1 g total) by mouth 4 (four) times daily -  with meals and at bedtime. 05/17/19   Fulp, Cammie, MD  triamcinolone cream (KENALOG) 0.1 % Apply 1 application topically 2 (two) times daily. 03/09/20   Particia Nearing, PA-C    Family History Family History  Problem Relation Age of Onset  . Cancer Mother   . Cervical cancer Mother   . Healthy Father   . Other Other        doesn't really know - but no h/o CAD, HLD HTN or DM.   Marland Kitchen Hypertension Sister     Social History Social History   Tobacco Use  . Smoking status: Former Smoker    Quit date: 10/08/2017    Years since quitting: 2.4  . Smokeless tobacco: Never Used  Vaping Use  . Vaping Use: Never used  Substance Use Topics  . Alcohol use: Yes    Comment: social  .  Drug use: Yes    Types: Marijuana     Allergies   Patient has no known allergies.   Review of Systems Review of Systems PER HPI   Physical Exam Triage Vital Signs ED Triage Vitals  Enc Vitals Group     BP 03/09/20 1147 111/75     Pulse Rate 03/09/20 1147 89     Resp 03/09/20 1147 16     Temp 03/09/20 1147 98.7 F (37.1 C)     Temp Source 03/09/20 1147 Oral     SpO2 03/09/20 1147 100 %     Weight --      Height --      Head Circumference --      Peak Flow --      Pain Score 03/09/20 1145 0     Pain Loc --      Pain Edu? --      Excl. in GC? --    No data found.  Updated Vital Signs BP 111/75 (BP Location: Right Arm)   Pulse 89   Temp 98.7 F (37.1 C) (Oral)   Resp 16    LMP 02/16/2020 (Exact Date)   SpO2 100%   Visual Acuity Right Eye Distance:   Left Eye Distance:   Bilateral Distance:    Right Eye Near:   Left Eye Near:    Bilateral Near:     Physical Exam Vitals and nursing note reviewed.  Constitutional:      Appearance: Normal appearance. She is not ill-appearing.  HENT:     Head: Atraumatic.     Nose: Nose normal.     Mouth/Throat:     Mouth: Mucous membranes are moist.     Pharynx: Oropharynx is clear. No posterior oropharyngeal erythema.  Eyes:     Extraocular Movements: Extraocular movements intact.     Conjunctiva/sclera: Conjunctivae normal.  Cardiovascular:     Rate and Rhythm: Normal rate and regular rhythm.     Heart sounds: Normal heart sounds.  Pulmonary:     Effort: Pulmonary effort is normal.     Breath sounds: Normal breath sounds. No wheezing or rales.  Musculoskeletal:        General: Normal range of motion.     Cervical back: Normal range of motion and neck supple.  Skin:    General: Skin is warm and dry.     Findings: Erythema (3 cm circular area of erythema and edema at COVID injection site left deltoid) present.  Neurological:     General: No focal deficit present.     Mental Status: She is alert and oriented to person, place, and time.  Psychiatric:        Mood and Affect: Mood normal.        Thought Content: Thought content normal.        Judgment: Judgment normal.      UC Treatments / Results  Labs (all labs ordered are listed, but only abnormal results are displayed) Labs Reviewed - No data to display  EKG   Radiology No results found.  Procedures Procedures (including critical care time)  Medications Ordered in UC Medications - No data to display  Initial Impression / Assessment and Plan / UC Course  I have reviewed the triage vital signs and the nursing notes.  Pertinent labs & imaging results that were available during my care of the patient were reviewed by me and considered in my  medical decision making (see chart for details).  Allergic site reaction from first COVID vaccine, with some generalized sxs of headache as well. Apply triamcinolone to area BID prn, ice intermittently, antihistamines 1-2 times daily until resolved. Work note given, rest and stay hydrated.   Final Clinical Impressions(s) / UC Diagnoses   Final diagnoses:  Allergic contact dermatitis due to other agents     Discharge Instructions     Apply ice off and on throughout the day for swelling, use the cream until issue is resolved. Take antihistamines 1-2 times daily    ED Prescriptions    Medication Sig Dispense Auth. Provider   triamcinolone cream (KENALOG) 0.1 % Apply 1 application topically 2 (two) times daily. 60 g Particia Nearing, [place]     PDMP not reviewed this encounter.   Particia Nearing, [place] 03/09/20 1230

## 2020-07-03 ENCOUNTER — Encounter (HOSPITAL_COMMUNITY): Payer: Self-pay

## 2020-07-03 ENCOUNTER — Ambulatory Visit (HOSPITAL_COMMUNITY)
Admission: EM | Admit: 2020-07-03 | Discharge: 2020-07-03 | Disposition: A | Discharge status: Home / Self Care | Payer: Self-pay | Attending: Family Medicine | Admitting: Family Medicine

## 2020-07-03 DIAGNOSIS — L03011 Cellulitis of right finger: Secondary | ICD-10-CM

## 2020-07-03 MED ORDER — HYDROCODONE-ACETAMINOPHEN 5-325 MG PO TABS: 1.0000 | ORAL_TABLET | Freq: Three times a day (TID) | ORAL | 0 refills | Status: AC | PRN

## 2020-07-03 MED ORDER — LIDOCAINE 5 % EX OINT: 1.0000 "application " | TOPICAL_OINTMENT | CUTANEOUS | 0 refills | Status: DC | PRN

## 2020-07-03 MED ORDER — HYDROCODONE-ACETAMINOPHEN 5-325 MG PO TABS: 1.0000 | ORAL_TABLET | Freq: Three times a day (TID) | ORAL | 0 refills | Status: DC | PRN

## 2020-07-03 MED ORDER — AMOXICILLIN-POT CLAVULANATE 875-125 MG PO TABS: 1.0000 | ORAL_TABLET | Freq: Two times a day (BID) | ORAL | 0 refills | Status: DC

## 2020-07-03 MED ORDER — LIDOCAINE HCL (PF) 1 % IJ SOLN
INTRAMUSCULAR | Status: AC
  Filled 2020-07-03: qty 2

## 2020-07-03 MED ORDER — LIDOCAINE 5 % EX OINT: 1.0000 "application " | TOPICAL_OINTMENT | CUTANEOUS | 0 refills | Status: AC | PRN

## 2020-07-03 MED ORDER — AMOXICILLIN-POT CLAVULANATE 875-125 MG PO TABS: 1.0000 | ORAL_TABLET | Freq: Two times a day (BID) | ORAL | 0 refills | Status: AC

## 2020-07-03 NOTE — ED Triage Notes (Signed)
Pt in with c/o abscess on right middle finger that she noticed on Monday  Pt has been using ibuprofen for pain relief  Denies drainage from  area

## 2020-07-03 NOTE — ED Notes (Signed)
Called [facility] [place]-canceled augmentin, hydrocodone, lidocaine-this was performed at provider request.

## 2020-07-04 NOTE — ED Provider Notes (Signed)
[place]    CSN: [id number] Arrival date & time: 07/03/20  0909      History   Chief Complaint Chief Complaint  Patient presents with  . Abscess    HPI Theresa Burke is a 40 y.o. female.   Here today with swelling, redness and pain to right middle distal finger x 2 days. Thinks the issue began with a hangnail on this finger. No known injury, fever, drainage, numbness, tingling. Using ice and ibuprofen with mild temporary relief.      Past Medical History:  Diagnosis Date  . Anxiety   . GERD (gastroesophageal reflux disease)     Patient Active Problem List   Diagnosis Date Noted  . Chest wall pain 12/01/2018    Past Surgical History:  Procedure Laterality Date  . BIOPSY  08/25/2019   Procedure: BIOPSY;  Surgeon: Wonda Horner, MD;  Location: [facility];  Service: Endoscopy;;  . ESOPHAGOGASTRODUODENOSCOPY (EGD) WITH PROPOFOL N/A 08/25/2019   Procedure: ESOPHAGOGASTRODUODENOSCOPY (EGD) WITH PROPOFOL;  Surgeon: Wonda Horner, MD;  Location: [facility];  Service: Endoscopy;  Laterality: N/A;  . NO PAST SURGERIES    . XI ROBOTIC ASSISTED VENTRAL HERNIA N/A 10/12/2019   Procedure: XI ROBOTIC ASSISTED VENTRAL HERNIA;  Surgeon: Jules Husbands, MD;  Location: [facility];  Service: General;  Laterality: N/A;    OB History   No obstetric history on file.      Home Medications    Prior to Admission medications   Medication Sig Start Date End Date Taking? Authorizing Provider  acetaminophen (TYLENOL) 500 MG tablet Take 1,000 mg by mouth every 8 (eight) hours as needed for moderate pain or headache.     [provider]  amoxicillin-clavulanate (AUGMENTIN) 875-125 MG tablet Take 1 tablet by mouth every 12 (twelve) hours. 07/03/20   Volney American, PA-C  gabapentin (NEURONTIN) 300 MG capsule Take 1 capsule (300 mg total) by mouth 3 (three) times daily. 10/25/19   Pabon, Marjory Lies, MD  HYDROcodone-acetaminophen (NORCO/VICODIN) 5-325 MG tablet Take 1  tablet by mouth 3 (three) times daily as needed for moderate pain. 07/03/20   Volney American, PA-C  ibuprofen (ADVIL) 800 MG tablet Take 1 tablet (800 mg total) by mouth every 8 (eight) hours as needed. 10/25/19   Pabon, Diego F, MD  lidocaine (XYLOCAINE) 5 % ointment Apply 1 application topically as needed. 07/03/20   Volney American, PA-C  omeprazole (PRILOSEC) 20 MG capsule Take 1 capsule (20 mg total) by mouth daily. 12/29/19   Camillia Herter, NP  sucralfate (CARAFATE) 1 GM/10ML suspension Take 10 mLs (1 g total) by mouth 4 (four) times daily -  with meals and at bedtime. 05/17/19   Fulp, Cammie, MD  triamcinolone cream (KENALOG) 0.1 % Apply 1 application topically 2 (two) times daily. 03/09/20   Volney American, PA-C    Family History Family History  Problem Relation Age of Onset  . Cancer Mother   . Cervical cancer Mother   . Healthy Father   . Other Other        doesn't really know - but no h/o CAD, HLD HTN or DM.   Marland Kitchen Hypertension Sister     Social History Social History   Tobacco Use  . Smoking status: Former Smoker    Quit date: 10/08/2017    Years since quitting: 2.7  . Smokeless tobacco: Never Used  Vaping Use  . Vaping Use: Never used  Substance Use Topics  .  Alcohol use: Yes    Comment: social  . Drug use: Yes    Types: Marijuana     Allergies   Patient has no known allergies.   Review of Systems Review of Systems PER HPI    Physical Exam Triage Vital Signs ED Triage Vitals  Enc Vitals Group     BP 07/03/20 1124 (!) 121/57     Pulse Rate 07/03/20 1124 82     Resp --      Temp 07/03/20 1124 97.8 F (36.6 C)     Temp src --      SpO2 07/03/20 1124 100 %     Weight --      Height --      Head Circumference --      Peak Flow --      Pain Score 07/03/20 1122 5     Pain Loc --      Pain Edu? --      Excl. in GC? --    No data found.  Updated Vital Signs BP (!) 121/57 (BP Location: Right Arm)   Pulse 82   Temp 97.8 F (36.6  C)   LMP 06/09/2020 (Approximate)   SpO2 100%   Visual Acuity Right Eye Distance:   Left Eye Distance:   Bilateral Distance:    Right Eye Near:   Left Eye Near:    Bilateral Near:     Physical Exam Vitals and nursing note reviewed.  Constitutional:      Appearance: Normal appearance. She is not ill-appearing.  HENT:     Head: Atraumatic.  Eyes:     Extraocular Movements: Extraocular movements intact.     Conjunctiva/sclera: Conjunctivae normal.  Cardiovascular:     Rate and Rhythm: Normal rate and regular rhythm.     Heart sounds: Normal heart sounds.  Pulmonary:     Effort: Pulmonary effort is normal.     Breath sounds: Normal breath sounds.  Musculoskeletal:        General: Swelling (right distal third finger) and tenderness present. Normal range of motion.     Cervical back: Normal range of motion and neck supple.  Skin:    General: Skin is warm and dry.     Findings: Erythema present.     Comments: No obvious abscess, swelling diffuse to distal right middle finger and no fluctuance present at this time  Neurological:     Mental Status: She is alert and oriented to person, place, and time.     Comments: Right hand neurovascularly intact throughout  Psychiatric:        Mood and Affect: Mood normal.        Thought Content: Thought content normal.        Judgment: Judgment normal.      UC Treatments / Results  Labs (all labs ordered are listed, but only abnormal results are displayed) Labs Reviewed - No data to display  EKG   Radiology No results found.  Procedures Incision and Drainage  Date/Time: 07/03/2020 1:30 PM Performed by: Particia Nearing, PA-C Authorized by: Particia Nearing, PA-C   Consent:    Consent obtained:  Verbal   Consent given by:  Patient   Risks, benefits, and alternatives were discussed: yes     Risks discussed:  Bleeding, incomplete drainage, pain and infection   Alternatives discussed:  Alternative  treatment Universal protocol:    Procedure explained and questions answered to patient or proxy's satisfaction: yes  Relevant documents present and verified: yes     Test results available and properly labeled: n/a.     Imaging studies available: n/a.     Required blood products, implants, devices, and special equipment available: n/a.     Site/side marked: yes     Immediately prior to procedure, a time out was called: yes     Patient identity confirmed:  Verbally with patient Location:    Indications for incision and drainage: early paronychia.   Location: distal right middle finger. Pre-procedure details:    Skin preparation:  Chlorhexidine with alcohol Sedation:    Sedation type:  None Anesthesia:    Anesthesia method:  Local infiltration   Local anesthetic:  Lidocaine 2% w/o epi Procedure type:    Complexity:  Simple Procedure details:    Ultrasound guidance: no     Needle aspiration: no     Incision types:  Stab incision   Incision depth:  Subcutaneous   Wound management:  Probed and deloculated   Drainage:  Bloody   Drainage amount:  Scant   Wound treatment:  Wound left open   Packing materials:  None Post-procedure details:    Procedure completion:  Procedure terminated at patient's request Comments:     Patient was unable to tolerate adequate infiltration of lidocaine for anesthesia, and after attempt at small incision without per her request procedure was terminated due to intolerance from pain   (including critical care time)  Medications Ordered in UC Medications - No data to display  Initial Impression / Assessment and Plan / UC Course  I have reviewed the triage vital signs and the nursing notes.  Pertinent labs & imaging results that were available during my care of the patient were reviewed by me and considered in my medical decision making (see chart for details).     Discussed with patient prior to procedure that it was early and an obvious abscess  had not yet formed that would be easily drained. She wanted to attempt procedure anyway but attempt was unsuccessful due to pain. Area wrapped in bandage, home wound care reviewed at length. Augmentin, lidocaine ointment, and small amount of norco given for rare prn severe pain. Precautions reviewed at length with this. Return if worsening and not resolving. PCP f/u next week for re-check.   Final Clinical Impressions(s) / UC Diagnoses   Final diagnoses:  Paronychia of finger of right hand   Discharge Instructions   None    ED Prescriptions    Medication Sig Dispense Auth. Provider   HYDROcodone-acetaminophen (NORCO/VICODIN) 5-325 MG tablet  (Status: Discontinued) Take 1 tablet by mouth 3 (three) times daily as needed for moderate pain. 10 tablet Particia Nearing, [place]   amoxicillin-clavulanate (AUGMENTIN) 875-125 MG tablet  (Status: Discontinued) Take 1 tablet by mouth every 12 (twelve) hours. 14 tablet Particia Nearing, [place]   lidocaine (XYLOCAINE) 5 % ointment  (Status: Discontinued) Apply 1 application topically as needed. 35.44 g Particia Nearing, PA-C   lidocaine (XYLOCAINE) 5 % ointment Apply 1 application topically as needed. 35.44 g Particia Nearing, PA-C   amoxicillin-clavulanate (AUGMENTIN) 875-125 MG tablet Take 1 tablet by mouth every 12 (twelve) hours. 14 tablet Particia Nearing, [place]   HYDROcodone-acetaminophen (NORCO/VICODIN) 5-325 MG tablet Take 1 tablet by mouth 3 (three) times daily as needed for moderate pain. 10 tablet Particia Nearing, [place]     I have reviewed the PDMP during this encounter.   Particia Nearing, [place] 07/04/20 1504
